# Patient Record
Sex: Female | Born: 1965 | Race: White | Hispanic: No | Marital: Single | Smoking: Current every day smoker
Health system: Southern US, Community
[De-identification: ages and names within clinical notes are randomized; demographics above are authoritative.]

## PROBLEM LIST (undated history)

## (undated) DIAGNOSIS — C801 Malignant (primary) neoplasm, unspecified: Secondary | ICD-10-CM

## (undated) DIAGNOSIS — I219 Acute myocardial infarction, unspecified: Secondary | ICD-10-CM

## (undated) DIAGNOSIS — D649 Anemia, unspecified: Secondary | ICD-10-CM

## (undated) DIAGNOSIS — J42 Unspecified chronic bronchitis: Secondary | ICD-10-CM

## (undated) DIAGNOSIS — R519 Headache, unspecified: Secondary | ICD-10-CM

## (undated) DIAGNOSIS — I10 Essential (primary) hypertension: Secondary | ICD-10-CM

## (undated) DIAGNOSIS — R51 Headache: Secondary | ICD-10-CM

## (undated) DIAGNOSIS — R011 Cardiac murmur, unspecified: Secondary | ICD-10-CM

## (undated) DIAGNOSIS — M1990 Unspecified osteoarthritis, unspecified site: Secondary | ICD-10-CM

---

## 1995-05-06 HISTORY — PX: LEEP: SHX91

## 2004-07-05 ENCOUNTER — Ambulatory Visit: Payer: Self-pay | Admitting: Family Medicine

## 2004-07-30 ENCOUNTER — Emergency Department: Payer: Self-pay | Admitting: Emergency Medicine

## 2005-03-30 ENCOUNTER — Emergency Department: Payer: Self-pay | Admitting: Emergency Medicine

## 2005-09-21 ENCOUNTER — Emergency Department: Payer: Self-pay | Admitting: General Practice

## 2007-03-16 ENCOUNTER — Emergency Department: Payer: Self-pay | Admitting: Unknown Physician Specialty

## 2007-07-21 ENCOUNTER — Emergency Department: Payer: Self-pay | Admitting: Unknown Physician Specialty

## 2008-05-31 ENCOUNTER — Ambulatory Visit: Payer: Self-pay | Admitting: General Surgery

## 2008-06-05 ENCOUNTER — Ambulatory Visit: Payer: Self-pay | Admitting: Gynecologic Oncology

## 2008-06-07 ENCOUNTER — Ambulatory Visit: Payer: Self-pay | Admitting: General Surgery

## 2008-06-20 ENCOUNTER — Ambulatory Visit: Payer: Self-pay | Admitting: Gynecologic Oncology

## 2008-07-19 ENCOUNTER — Ambulatory Visit: Payer: Self-pay | Admitting: General Surgery

## 2008-10-07 IMAGING — CR DG CHEST 2V
1 series · 2 of 2 positions shown · non-contrast
Comparison: none

REASON FOR EXAM: Cough
COMMENTS:

[Series 1: view not recorded · 0.17mm/px · 2 of 2 slices shown]
[im 1/2]
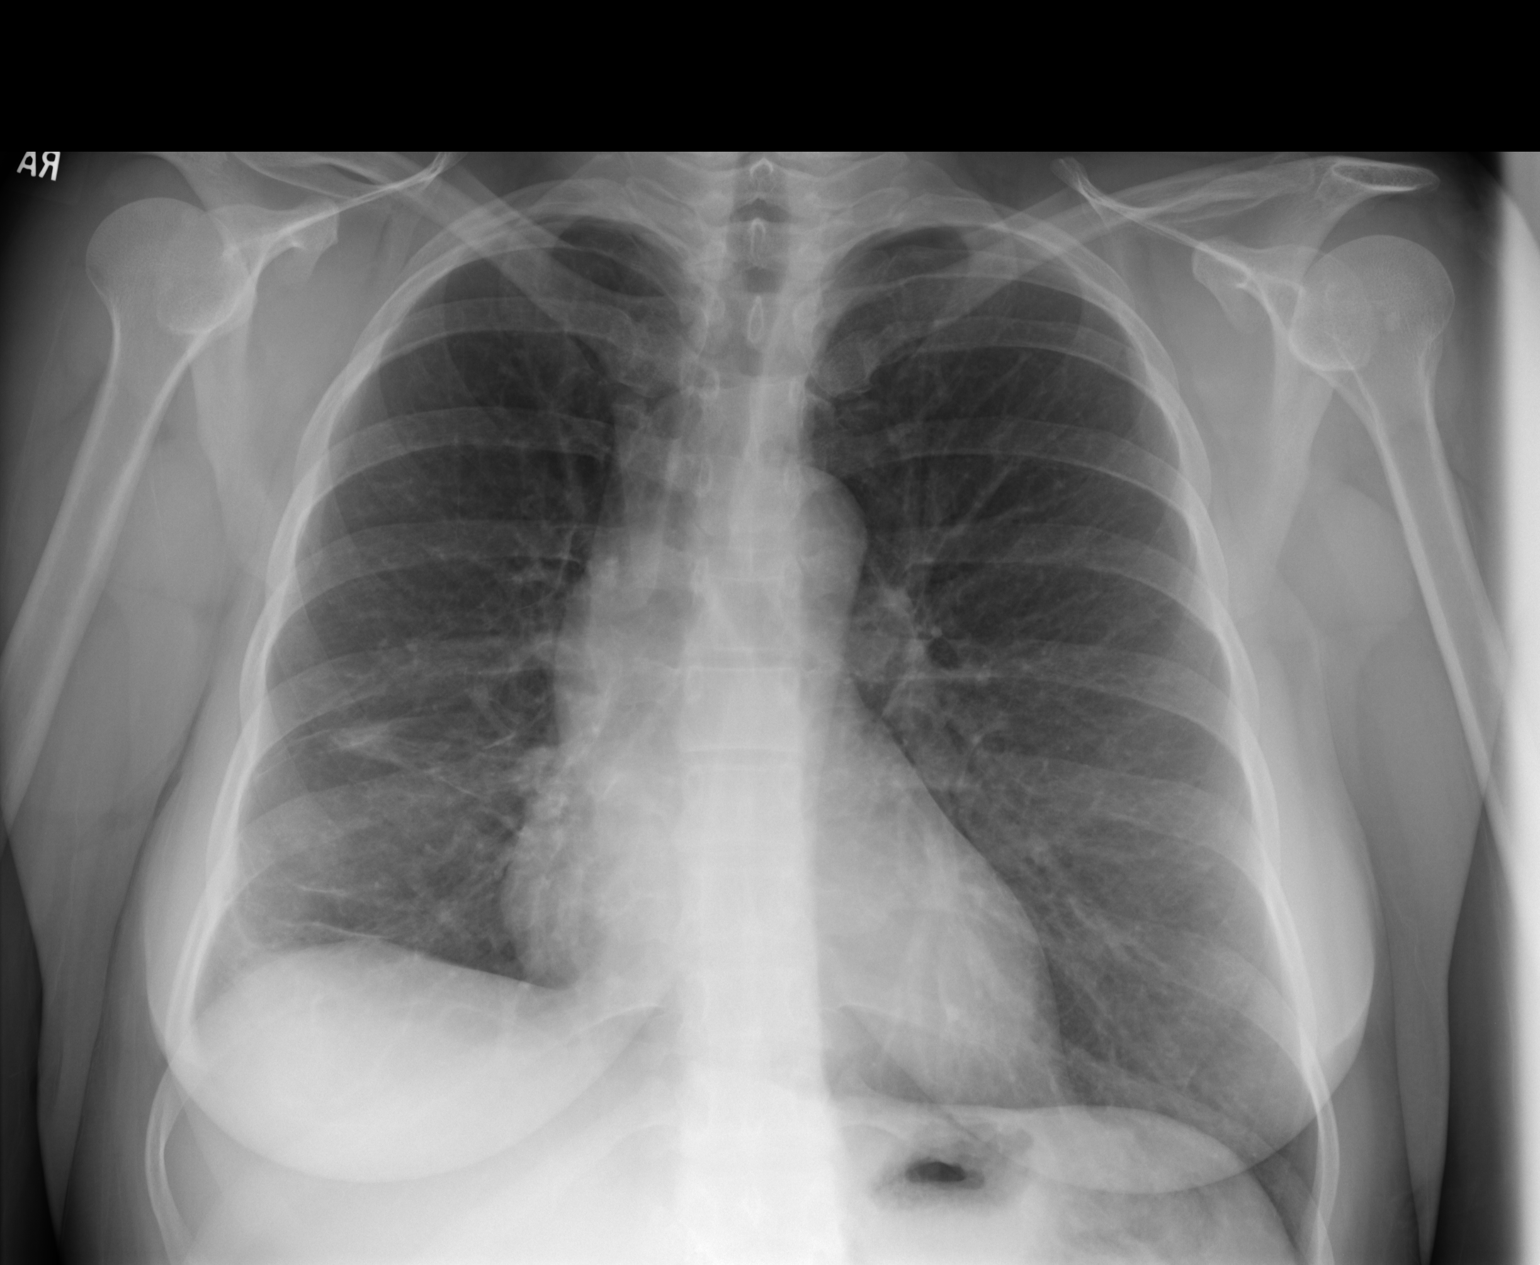
[im 2/2]
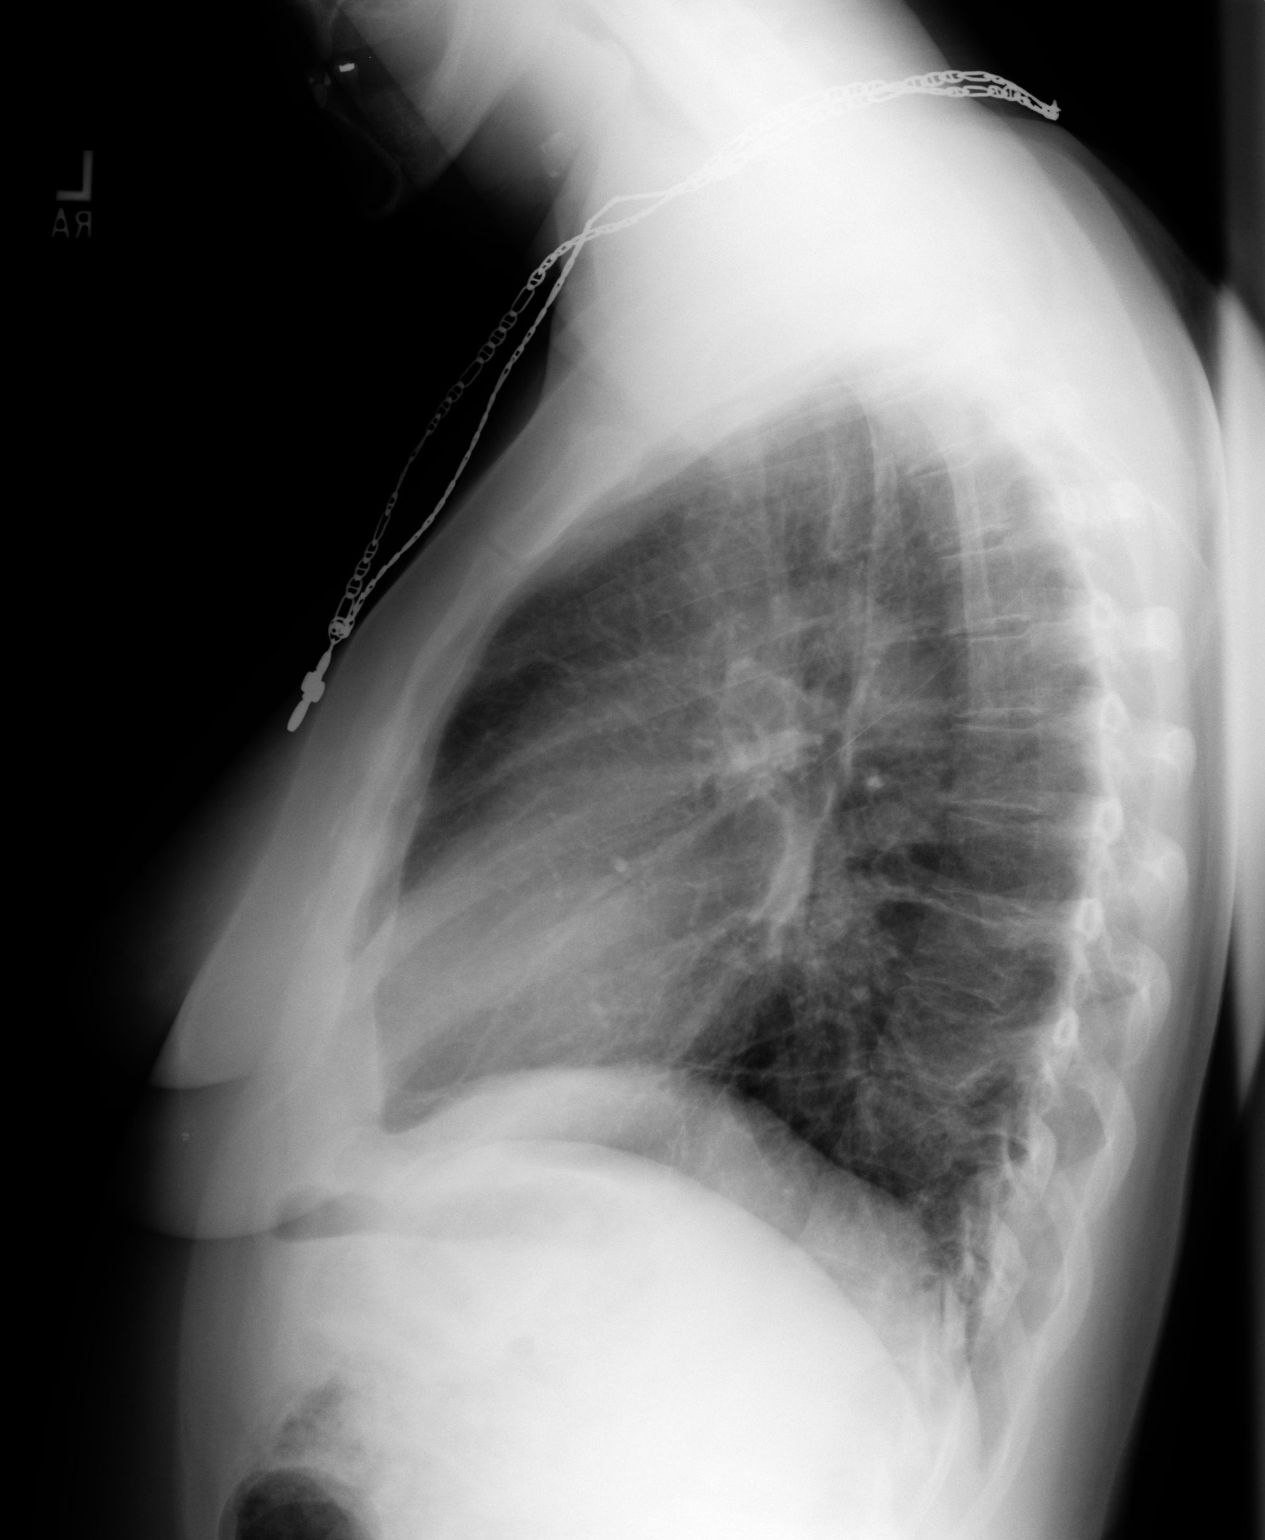

[2 of 2 positions shown; findings below may reference images not displayed]

PROCEDURE:     DXR - DXR CHEST PA (OR AP) AND LATERAL  - March 16, 2007 [DATE]

RESULT:     Linear areas of increased density project in the region of the
RIGHT lower lobe. No focal regions of consolidation are demonstrated. The
cardiac silhouette is within normal limits. The visualized bony skeleton is
unremarkable. There is elevation of the RIGHT hemidiaphragm.
IMPRESSION: Atelectasis versus scarring in the region of the RIGHT lower
lobe and early or mild infiltrate cannot be completely excluded, if
clinically warranted.

## 2008-11-02 ENCOUNTER — Ambulatory Visit: Payer: Self-pay | Admitting: Gynecologic Oncology

## 2008-11-13 ENCOUNTER — Ambulatory Visit: Payer: Self-pay | Admitting: Gynecologic Oncology

## 2010-06-27 ENCOUNTER — Emergency Department: Payer: Self-pay | Admitting: Emergency Medicine

## 2011-06-15 ENCOUNTER — Emergency Department: Payer: Self-pay | Admitting: Emergency Medicine

## 2011-08-21 ENCOUNTER — Emergency Department: Payer: Self-pay | Admitting: Emergency Medicine

## 2011-10-06 ENCOUNTER — Emergency Department: Payer: Self-pay | Admitting: Emergency Medicine

## 2011-10-18 ENCOUNTER — Emergency Department: Payer: Self-pay | Admitting: Emergency Medicine

## 2011-10-18 LAB — CK TOTAL AND CKMB (NOT AT ARMC): CK, Total: 57 U/L (ref 21–215)

## 2011-10-18 LAB — BASIC METABOLIC PANEL
Calcium, Total: 8.3 mg/dL — ABNORMAL LOW (ref 8.5–10.1)
Co2: 24 mmol/L (ref 21–32)
EGFR (African American): >60
Potassium: 4 mmol/L (ref 3.5–5.1)

## 2011-10-18 LAB — CBC
HGB: 11.6 g/dL — ABNORMAL LOW (ref 12.0–16.0)
Platelet: 181 10*3/uL (ref 150–440)
RBC: 3.93 10*6/uL (ref 3.80–5.20)

## 2011-11-07 ENCOUNTER — Emergency Department: Payer: Self-pay | Admitting: Emergency Medicine

## 2015-10-28 ENCOUNTER — Encounter: Payer: Self-pay | Admitting: Emergency Medicine

## 2015-10-28 ENCOUNTER — Emergency Department
Admission: EM | Admit: 2015-10-28 | Discharge: 2015-10-28 | Disposition: A | Discharge status: Home / Self Care | Payer: BLUE CROSS/BLUE SHIELD | Attending: Emergency Medicine | Admitting: Emergency Medicine

## 2015-10-28 DIAGNOSIS — I10 Essential (primary) hypertension: Secondary | ICD-10-CM | POA: Diagnosis not present

## 2015-10-28 DIAGNOSIS — L03116 Cellulitis of left lower limb: Secondary | ICD-10-CM | POA: Insufficient documentation

## 2015-10-28 DIAGNOSIS — F17210 Nicotine dependence, cigarettes, uncomplicated: Secondary | ICD-10-CM | POA: Insufficient documentation

## 2015-10-28 DIAGNOSIS — L539 Erythematous condition, unspecified: Secondary | ICD-10-CM | POA: Diagnosis present

## 2015-10-28 MED ORDER — CEPHALEXIN 500 MG PO CAPS: 500.0000 mg | ORAL_CAPSULE | Freq: Four times a day (QID) | ORAL | Status: DC

## 2015-10-28 NOTE — ED Provider Notes (Signed)
[facility] Emergency Department Provider Note  ____________________________________________  Time seen: Approximately 6:54 PM  I have reviewed the triage vital signs and the nursing notes.   HISTORY  Chief Complaint Foot Swelling    HPI Dorothy Gay is a 50 y.o. female who presents emergency department complaining of a "blister" between her first and second digits left foot. Patient states that she initially noticed a blister several days ago. It pops, and then returned before popping and turning into a erythematous lesion. Patient states that it is painful and irritating. She reports that there was some swelling around this area that is not as severe as it was yesterday. She denies any numbness or tingling in her foot. She denies any history of recurrent skin lesions.  Patient presents emergency Department with an elevated blood pressure of 210/90. Patient reports that she has a long-standing history of familial hypertension. Patient reports that her blood pressure has been running in the 200/100 range the past several months. Patient is aware of complications with high blood pressure and states that she has been in the process of obtaining insurance. This was approved within the last month and she has contacted primary care for an appointment. Patient states that she has a appointment scheduled follow-up with primary care for general physical as well as addressing her blood pressure. Patient denies any chest pain, shortness of breath, headache, visual changes, numbness or tinging in any extremity.  History reviewed. No pertinent past medical history.  There are no active problems to display for this patient.   Past Surgical History  Procedure Laterality Date  . Cesarean section  1988     Current Outpatient Rx  Name  Route  Sig  Dispense  Refill  . cephALEXin (KEFLEX) 500 MG capsule   Oral   Take 1 capsule (500 mg total) by mouth 4 (four) times daily.  28 capsule   0     Allergies Review of patient's allergies indicates no known allergies.  No family history on file.  Social History Social History  Substance Use Topics  . Smoking status: Current Every Day Smoker -- 0.50 packs/day    Types: Cigarettes  . Smokeless tobacco: Never Used  . Alcohol Use: No     Review of Systems  Constitutional: No fever/chills Cardiovascular: no chest pain. Respiratory: no cough. No SOB. Musculoskeletal: Negative for musculoskeletal pain. Skin: Positive for erythematous lesion between the first and second digit left foot. Neurological: Negative for headaches, focal weakness or numbness. 10-point ROS otherwise negative.  ____________________________________________   PHYSICAL EXAM:  VITAL SIGNS: ED Triage Vitals  Enc Vitals Group     BP 10/28/15 1738 209/93 mmHg     Pulse Rate 10/28/15 1738 66     Resp 10/28/15 1738 18     Temp 10/28/15 1738 98.1 F (36.7 C)     Temp src --      SpO2 10/28/15 1738 93 %     Weight 10/28/15 1738 170 lb (77.111 kg)     Height 10/28/15 1738 5\' 7"  (1.702 m)     Head Cir --      Peak Flow --      Pain Score 10/28/15 1743 4     Pain Loc --      Pain Edu? --      Excl. in [place]? --      Constitutional: Alert and oriented. Well appearing and in no acute distress. Eyes: Conjunctivae are normal. PERRL. EOMI. Head: Atraumatic. Cardiovascular:  Normal rate, regular rhythm. Normal S1 and S2.  Good peripheral circulation. Respiratory: Normal respiratory effort without tachypnea or retractions. Lungs CTAB. Good air entry to the bases with no decreased or absent breath sounds. Musculoskeletal: Full range of motion to all extremities. No gross deformities appreciated. Neurologic:  Normal speech and language. No gross focal neurologic deficits are appreciated. Cranial nerves II through XII are grossly intact. Skin:  Skin is warm, dry and intact. No rash noted. Erythematous and edematous skin lesion is noted between  the first and second digits left foot. There is a small scab consistent with excoriation from scratching noted in the center of this lesion. Area is firm to palpation. No fluctuance noted. No drainage noted. Area is tender to palpation. This measures approximately 1 x 2 cm. No surrounding erythema or edema. Capillary refill and sensation intact all 5 digits. Dorsalis pedis pulses appreciated in the affected extremity. Psychiatric: Mood and affect are normal. Speech and behavior are normal. Patient exhibits appropriate insight and judgement.   ____________________________________________   LABS (all labs ordered are listed, but only abnormal results are displayed)  Labs Reviewed - No data to display ____________________________________________  EKG   ____________________________________________  RADIOLOGY   No results found.  ____________________________________________    PROCEDURES  Procedure(s) performed:       Medications - No data to display   ____________________________________________   INITIAL IMPRESSION / ASSESSMENT AND PLAN / ED COURSE  Pertinent labs & imaging results that were available during my care of the patient were reviewed by me and considered in my medical decision making (see chart for details).  Patient's diagnosis is consistent with cellulitis of the left foot. No concern for abscess at this time. Area is localized.. Patient will be discharged home with prescriptions for antibiotics.. Patient has an elevated blood pressure in the emergency department. Patient is aware of this and states that she consistently has a blood pressure in the 200/100 range. Patient was waiting on insurance to see primary care which is now in place. She has an appointment to follow-up for general physical as well as addressing her blood pressure. As such, no medications will be prescribed for her blood pressure. She has no concerning symptoms of chest pain, shortness of  breath, headache, strokelike symptoms. Exam was reassuring at this time. Patient is given strict ED precautions to return to the ED for any worsening or new symptoms.     ____________________________________________  FINAL CLINICAL IMPRESSION(S) / ED DIAGNOSES  Final diagnoses:  Cellulitis of foot, left  Essential hypertension      NEW MEDICATIONS STARTED DURING THIS VISIT:  New Prescriptions   CEPHALEXIN (KEFLEX) 500 MG CAPSULE    Take 1 capsule (500 mg total) by mouth 4 (four) times daily.        This chart was dictated using voice recognition software/Dragon. Despite best efforts to proofread, errors can occur which can change the meaning. Any change was purely unintentional.    Darletta Moll, PA-C 10/28/15 1907  Orbie Pyo, MD 10/29/15 [phone]

## 2015-10-28 NOTE — ED Notes (Signed)
Pt with blisters that come and go between left great toe and second toe. Pain and swelling under left foot that started yesterday. Pt also c/o foot being itchy.

## 2015-10-28 NOTE — ED Notes (Signed)
Pt states she noticed a blister in between left big toe. Pt states she popped the blister today but it came back. Pt c/o pain to the top and bottom of the feet.  Pt worried that something is going with her right foot as well as there is a small red spot on the top of the right foot.

## 2015-10-28 NOTE — ED Notes (Signed)
Reviewed d/c instructions, follow-up care, and prescription with pt. Pt verbalized understanding 

## 2015-10-28 NOTE — Discharge Instructions (Signed)
Cellulitis Cellulitis is an infection of the skin and the tissue beneath it. The infected area is usually red and tender. Cellulitis occurs most often in the arms and lower legs.  CAUSES  Cellulitis is caused by bacteria that enter the skin through cracks or cuts in the skin. The most common types of bacteria that cause cellulitis are staphylococci and streptococci. SIGNS AND SYMPTOMS   Redness and warmth.  Swelling.  Tenderness or pain.  Fever. DIAGNOSIS  Your health care provider can usually determine what is wrong based on a physical exam. Blood tests may also be done. TREATMENT  Treatment usually involves taking an antibiotic medicine. HOME CARE INSTRUCTIONS   Take your antibiotic medicine as directed by your health care provider. Finish the antibiotic even if you start to feel better.  Keep the infected arm or leg elevated to reduce swelling.  Apply a warm cloth to the affected area up to 4 times per day to relieve pain.  Take medicines only as directed by your health care provider.  Keep all follow-up visits as directed by your health care provider. SEEK MEDICAL CARE IF:   You notice red streaks coming from the infected area.  Your red area gets larger or turns dark in color.  Your bone or joint underneath the infected area becomes painful after the skin has healed.  Your infection returns in the same area or another area.  You notice a swollen bump in the infected area.  You develop new symptoms.  You have a fever. SEEK IMMEDIATE MEDICAL CARE IF:   You feel very sleepy.  You develop vomiting or diarrhea.  You have a general ill feeling (malaise) with muscle aches and pains.   This information is not intended to replace advice given to you by your health care provider. Make sure you discuss any questions you have with your health care provider.   Document Released: 01/29/2005 Document Revised: 01/10/2015 Document Reviewed: 07/07/2011 Elsevier Interactive  Patient Education 2016 [employer].  Hypertension Hypertension, commonly called high blood pressure, is when the force of blood pumping through your arteries is too strong. Your arteries are the blood vessels that carry blood from your heart throughout your body. A blood pressure reading consists of a higher number over a lower number, such as 110/72. The higher number (systolic) is the pressure inside your arteries when your heart pumps. The lower number (diastolic) is the pressure inside your arteries when your heart relaxes. Ideally you want your blood pressure below 120/80. Hypertension forces your heart to work harder to pump blood. Your arteries may become narrow or stiff. Having untreated or uncontrolled hypertension can cause heart attack, stroke, kidney disease, and other problems. RISK FACTORS Some risk factors for high blood pressure are controllable. Others are not.  Risk factors you cannot control include:   Race. You may be at higher risk if you are African American.  Age. Risk increases with age.  Gender. Men are at higher risk than women before age 71 years. After age 51, women are at higher risk than men. Risk factors you can control include:  Not getting enough exercise or physical activity.  Being overweight.  Getting too much fat, sugar, calories, or salt in your diet.  Drinking too much alcohol. SIGNS AND SYMPTOMS Hypertension does not usually cause signs or symptoms. Extremely high blood pressure (hypertensive crisis) may cause headache, anxiety, shortness of breath, and nosebleed. DIAGNOSIS To check if you have hypertension, your health care provider will measure your  blood pressure while you are seated, with your arm held at the level of your heart. It should be measured at least twice using the same arm. Certain conditions can cause a difference in blood pressure between your right and left arms. A blood pressure reading that is higher than normal on one occasion  does not mean that you need treatment. If it is not clear whether you have high blood pressure, you may be asked to return on a different day to have your blood pressure checked again. Or, you may be asked to monitor your blood pressure at home for 1 or more weeks. TREATMENT Treating high blood pressure includes making lifestyle changes and possibly taking medicine. Living a healthy lifestyle can help lower high blood pressure. You may need to change some of your habits. Lifestyle changes may include:  Following the DASH diet. This diet is high in fruits, vegetables, and whole grains. It is low in salt, red meat, and added sugars.  Keep your sodium intake below 2,300 mg per day.  Getting at least 30-45 minutes of aerobic exercise at least 4 times per week.  Losing weight if necessary.  Not smoking.  Limiting alcoholic beverages.  Learning ways to reduce stress. Your health care provider may prescribe medicine if lifestyle changes are not enough to get your blood pressure under control, and if one of the following is true:  You are 16-63 years of age and your systolic blood pressure is above 140.  You are 13 years of age or older, and your systolic blood pressure is above 150.  Your diastolic blood pressure is above 90.  You have diabetes, and your systolic blood pressure is over [id number] or your diastolic blood pressure is over 90.  You have kidney disease and your blood pressure is above 140/90.  You have heart disease and your blood pressure is above 140/90. Your personal target blood pressure may vary depending on your medical conditions, your age, and other factors. HOME CARE INSTRUCTIONS  Have your blood pressure rechecked as directed by your health care provider.   Take medicines only as directed by your health care provider. Follow the directions carefully. Blood pressure medicines must be taken as prescribed. The medicine does not work as well when you skip doses. Skipping  doses also puts you at risk for problems.  Do not smoke.   Monitor your blood pressure at home as directed by your health care provider. SEEK MEDICAL CARE IF:   You think you are having a reaction to medicines taken.  You have recurrent headaches or feel dizzy.  You have swelling in your ankles.  You have trouble with your vision. SEEK IMMEDIATE MEDICAL CARE IF:  You develop a severe headache or confusion.  You have unusual weakness, numbness, or feel faint.  You have severe chest or abdominal pain.  You vomit repeatedly.  You have trouble breathing. MAKE SURE YOU:   Understand these instructions.  Will watch your condition.  Will get help right away if you are not doing well or get worse.   This information is not intended to replace advice given to you by your health care provider. Make sure you discuss any questions you have with your health care provider.   Document Released: 04/21/2005 Document Revised: 09/05/2014 Document Reviewed: 02/11/2013 Elsevier Interactive Patient Education [employer].

## 2016-05-05 DIAGNOSIS — I219 Acute myocardial infarction, unspecified: Secondary | ICD-10-CM

## 2016-05-05 HISTORY — DX: Acute myocardial infarction, unspecified: I21.9

## 2016-09-07 ENCOUNTER — Observation Stay
Admission: EM | Admit: 2016-09-07 | Discharge: 2016-09-08 | Disposition: A | Discharge status: Home / Self Care | Payer: BLUE CROSS/BLUE SHIELD | Attending: Internal Medicine | Admitting: Internal Medicine

## 2016-09-07 ENCOUNTER — Encounter: Payer: Self-pay | Admitting: Emergency Medicine

## 2016-09-07 ENCOUNTER — Emergency Department: Payer: BLUE CROSS/BLUE SHIELD

## 2016-09-07 DIAGNOSIS — R079 Chest pain, unspecified: Secondary | ICD-10-CM | POA: Diagnosis not present

## 2016-09-07 DIAGNOSIS — Z79899 Other long term (current) drug therapy: Secondary | ICD-10-CM | POA: Diagnosis not present

## 2016-09-07 DIAGNOSIS — F17210 Nicotine dependence, cigarettes, uncomplicated: Secondary | ICD-10-CM | POA: Diagnosis not present

## 2016-09-07 DIAGNOSIS — I10 Essential (primary) hypertension: Secondary | ICD-10-CM | POA: Diagnosis not present

## 2016-09-07 DIAGNOSIS — E876 Hypokalemia: Secondary | ICD-10-CM | POA: Diagnosis not present

## 2016-09-07 DIAGNOSIS — Z8249 Family history of ischemic heart disease and other diseases of the circulatory system: Secondary | ICD-10-CM | POA: Diagnosis not present

## 2016-09-07 DIAGNOSIS — R002 Palpitations: Secondary | ICD-10-CM | POA: Diagnosis present

## 2016-09-07 DIAGNOSIS — Z888 Allergy status to other drugs, medicaments and biological substances status: Secondary | ICD-10-CM | POA: Diagnosis not present

## 2016-09-07 HISTORY — DX: Essential (primary) hypertension: I10

## 2016-09-07 LAB — CBC
HCT: 41.1 % (ref 35.0–47.0)
Hemoglobin: 13.9 g/dL (ref 12.0–16.0)
MCH: 29.5 pg (ref 26.0–34.0)
MCHC: 33.7 g/dL (ref 32.0–36.0)
MCV: 87.5 fL (ref 80.0–100.0)
PLATELETS: 234 10*3/uL (ref 150–440)
RBC: 4.7 MIL/uL (ref 3.80–5.20)
RDW: 14.1 % (ref 11.5–14.5)
WBC: 6.9 10*3/uL (ref 3.6–11.0)

## 2016-09-07 LAB — TROPONIN I
TROPONIN I: 0.06 ng/mL — AB (ref <0.03)
TROPONIN I: 0.04 ng/mL — AB (ref <0.03)
Troponin I: 0.05 ng/mL (ref <0.03)

## 2016-09-07 LAB — BASIC METABOLIC PANEL
ANION GAP: 10 (ref 5–15)
BUN: 8 mg/dL (ref 6–20)
CALCIUM: 9.1 mg/dL (ref 8.9–10.3)
CO2: 25 mmol/L (ref 22–32)
CREATININE: 0.62 mg/dL (ref 0.44–1.00)
Chloride: 106 mmol/L (ref 101–111)
GLUCOSE: 128 mg/dL — AB (ref 65–99)
Potassium: 2.9 mmol/L — ABNORMAL LOW (ref 3.5–5.1)
Sodium: 141 mmol/L (ref 135–145)

## 2016-09-07 LAB — MAGNESIUM: MAGNESIUM: 2.1 mg/dL (ref 1.7–2.4)

## 2016-09-07 MED ORDER — ACETAMINOPHEN 650 MG RE SUPP: 650.0000 mg | Freq: Four times a day (QID) | RECTAL | Status: DC | PRN

## 2016-09-07 MED ORDER — ONDANSETRON HCL 4 MG PO TABS
4.0000 mg | ORAL_TABLET | Freq: Four times a day (QID) | ORAL | Status: DC | PRN
Start: 2016-09-07 — End: 2016-09-08

## 2016-09-07 MED ORDER — NITROGLYCERIN 0.4 MG SL SUBL: 0.4000 mg | SUBLINGUAL_TABLET | SUBLINGUAL | Status: DC | PRN

## 2016-09-07 MED ORDER — ACETAMINOPHEN 325 MG PO TABS: 650.0000 mg | ORAL_TABLET | Freq: Four times a day (QID) | ORAL | Status: DC | PRN

## 2016-09-07 MED ORDER — ASPIRIN EC 81 MG PO TBEC
81.0000 mg | DELAYED_RELEASE_TABLET | Freq: Every day | ORAL | Status: DC
  Administered 2016-09-08: 81 mg via ORAL
  Filled 2016-09-07: qty 1

## 2016-09-07 MED ORDER — ENOXAPARIN SODIUM 40 MG/0.4ML ~~LOC~~ SOLN
40.0000 mg | SUBCUTANEOUS | Status: DC
  Administered 2016-09-07: 40 mg via SUBCUTANEOUS
  Filled 2016-09-07: qty 0.4

## 2016-09-07 MED ORDER — MORPHINE SULFATE (PF) 2 MG/ML IV SOLN: 2.0000 mg | INTRAVENOUS | Status: DC | PRN

## 2016-09-07 MED ORDER — HYDROCHLOROTHIAZIDE 12.5 MG PO CAPS
12.5000 mg | ORAL_CAPSULE | Freq: Every day | ORAL | Status: DC
  Administered 2016-09-08: 12.5 mg via ORAL
  Filled 2016-09-07: qty 1

## 2016-09-07 MED ORDER — ONDANSETRON HCL 4 MG/2ML IJ SOLN: 4.0000 mg | Freq: Four times a day (QID) | INTRAMUSCULAR | Status: DC | PRN

## 2016-09-07 MED ORDER — POTASSIUM CHLORIDE CRYS ER 20 MEQ PO TBCR
20.0000 meq | EXTENDED_RELEASE_TABLET | Freq: Two times a day (BID) | ORAL | Status: DC
  Administered 2016-09-07 – 2016-09-08 (×2): 20 meq via ORAL
  Filled 2016-09-07 (×2): qty 1

## 2016-09-07 MED ORDER — AMLODIPINE BESYLATE 10 MG PO TABS
10.0000 mg | ORAL_TABLET | Freq: Every day | ORAL | Status: DC
  Filled 2016-09-07: qty 1

## 2016-09-07 MED ORDER — ASPIRIN 81 MG PO CHEW
324.0000 mg | CHEWABLE_TABLET | Freq: Once | ORAL | Status: AC
  Administered 2016-09-07: 324 mg via ORAL
  Filled 2016-09-07: qty 4

## 2016-09-07 NOTE — ED Notes (Signed)
Sarah, EDT to bedside to draw 2nd troponin.

## 2016-09-07 NOTE — ED Notes (Signed)
Date and time results received: 09/07/16 1610   Test: Troponin Critical Value: 0.06  Name of Provider Notified: Dr. Archie Balboa  Orders Received? Or Actions Taken?: Orders Received - See Orders for details and Actions Taken: Pt to be admitted.

## 2016-09-07 NOTE — Progress Notes (Signed)
Pt. Signed consent for stress test. Placed on chart

## 2016-09-07 NOTE — ED Notes (Signed)
This RN to bedside at this time. Apologized repeatedly to patient and family for delay in getting into room, explained multiple critical patients, pt and family state understanding. Pt states that she has been dozing. This RN explained was waiting for 2nd troponin to come back. Pt states understanding at this time. NAD noted, respirations even and unlabored, skin warm, dry, and intact. Pt denies any needs at this time, will continue to monitor.

## 2016-09-07 NOTE — ED Notes (Signed)
Pt was given a cup of water per request with MD approval

## 2016-09-07 NOTE — H&P (Signed)
[facility] NAME: Dorothy Gay    MR#:  [id number]  DATE OF BIRTH:  07-09-1965  DATE OF ADMISSION:  09/07/2016  PRIMARY CARE PHYSICIAN: Patient, No Pcp Per   REQUESTING/REFERRING PHYSICIAN: Dr. Harvest Dark  CHIEF COMPLAINT:   Chief Complaint  Patient presents with  . Palpitations    HISTORY OF PRESENT ILLNESS:  Dorothy Gay  is a 51 y.o. female with a known history of Essential hypertension who presents to the hospital due to palpitations and some chest heaviness. Patient says she woke up this morning around 9:30 this morning feeling like her heart was racing associated with some tightness in her chest and also left arm pain. She also had some nausea but no vomiting. She admits to a headache but no dizziness diaphoresis or syncope. Patient has never had these symptoms before. She denies any previous history of heart attacks, or any worsening anxiety or stressor in her life presently. She presented to the ER and underwent blood work which showed a mildly elevated troponin. Hospitalist services were contacted further treatment and evaluation.  PAST MEDICAL HISTORY:   Past Medical History:  Diagnosis Date  . Hypertension     PAST SURGICAL HISTORY:   Past Surgical History:  Procedure Laterality Date  . CESAREAN SECTION  1988     SOCIAL HISTORY:   Social History  Substance Use Topics  . Smoking status: Current Every Day Smoker    Packs/day: 0.50    Types: Cigarettes  . Smokeless tobacco: Never Used  . Alcohol use No    FAMILY HISTORY:   Family History  Problem Relation Age of Onset  . Hypertension Mother   . Atrial fibrillation Mother   . Heart disease Maternal Grandmother     DRUG ALLERGIES:   Allergies  Allergen Reactions  . Amlodipine Nausea And Vomiting  . Hydrochlorothiazide Nausea And Vomiting  . Lisinopril Cough    REVIEW OF SYSTEMS:   Review of Systems  Constitutional: Negative for fever and  weight loss.  HENT: Negative for congestion, nosebleeds and tinnitus.   Eyes: Negative for blurred vision, double vision and redness.  Respiratory: Negative for cough, hemoptysis and shortness of breath.   Cardiovascular: Positive for chest pain and palpitations. Negative for orthopnea, leg swelling and PND.  Gastrointestinal: Negative for abdominal pain, diarrhea, melena, nausea and vomiting.  Genitourinary: Negative for dysuria, hematuria and urgency.  Musculoskeletal: Negative for falls and joint pain.  Neurological: Negative for dizziness, tingling, sensory change, focal weakness, seizures, weakness and headaches.  Endo/Heme/Allergies: Negative for polydipsia. Does not bruise/bleed easily.  Psychiatric/Behavioral: Negative for depression and memory loss. The patient is not nervous/anxious.     MEDICATIONS AT HOME:   Prior to Admission medications   Medication Sig Start Date End Date Taking? Authorizing Provider  amLODipine (NORVASC) 10 MG tablet Take 10 mg by mouth daily. 06/13/16  Yes [provider]  hydrochlorothiazide (MICROZIDE) 12.5 MG capsule Take 12.5 mg by mouth daily. 06/13/16  Yes [provider]  cephALEXin (KEFLEX) 500 MG capsule Take 1 capsule (500 mg total) by mouth 4 (four) times daily. Patient not taking: Reported on 09/07/2016 10/28/15   Cuthriell, Charline Bills, PA-C      VITAL SIGNS:  Blood pressure (!) 162/79, pulse (!) 55, temperature 98.1 F (36.7 C), temperature source Oral, resp. rate 16, height 5\' 7"  (1.702 m), weight 77.1 kg (170 lb), SpO2 96 %.  PHYSICAL EXAMINATION:  Physical Exam  GENERAL:  51  y.o.-year-old patient lying in bed in no acute distress.  EYES: Pupils equal, round, reactive to light and accommodation. No scleral icterus. Extraocular muscles intact.  HEENT: Head atraumatic, normocephalic. Oropharynx and nasopharynx clear. No oropharyngeal erythema, moist oral mucosa  NECK:  Supple, no jugular venous distention. No thyroid  enlargement, no tenderness.  LUNGS: Normal breath sounds bilaterally, no wheezing, rales, rhonchi. No use of accessory muscles of respiration.  CARDIOVASCULAR: S1, S2 RRR. No murmurs, rubs, gallops, clicks.  ABDOMEN: Soft, nontender, nondistended. Bowel sounds present. No organomegaly or mass.  EXTREMITIES: No pedal edema, cyanosis, or clubbing. + 2 pedal & radial pulses b/l.   NEUROLOGIC: Cranial nerves II through XII are intact. No focal Motor or sensory deficits appreciated b/l.  PSYCHIATRIC: The patient is alert and oriented x 3. SKIN: No obvious rash, lesion, or ulcer.   LABORATORY PANEL:   CBC  Recent Labs Lab 09/07/16 1137  WBC 6.9  HGB 13.9  HCT 41.1  PLT 234   ------------------------------------------------------------------------------------------------------------------  Chemistries   Recent Labs Lab 09/07/16 1137  NA 141  K 2.9*  CL 106  CO2 25  GLUCOSE 128*  BUN 8  CREATININE 0.62  CALCIUM 9.1   ------------------------------------------------------------------------------------------------------------------  Cardiac Enzymes  Recent Labs Lab 09/07/16 1505  TROPONINI 0.06*   ------------------------------------------------------------------------------------------------------------------  RADIOLOGY:  Dg Chest 2 View  Result Date: 09/07/2016 CLINICAL DATA:  Heart palpitations today. History of hypertension. Smoker. EXAM: CHEST  2 VIEW COMPARISON:  Radiographs 03/16/2007. FINDINGS: The heart size and mediastinal contours are stable. There is stable mild pleural thickening and right basilar scarring. The left lung is clear. No edema, confluent airspace opacity, pleural effusion or pneumothorax demonstrated. The bones appear unchanged. IMPRESSION: Stable chest with stable right basilar scarring. No acute cardiopulmonary process. Electronically Signed   By: Richardean Sale M.D.   On: 09/07/2016 12:13     IMPRESSION AND PLAN:   51 year old female with  past medical history of hypertension, tobacco abuse who presents to the hospital due to palpitations, chest tightness.  1. Chest pain/tightness-recent symptoms are very atypical but she does have significant cardiac risk factors given her hypertension, tobacco abuse and family history of heart disease. -We'll observe him on telemetry, her second troponin is only mildly elevated and I will continue to cycle them. -EKG shows LVH with voltage anterior but no other acute ST or T wave changes. -Continue aspirin, nitroglycerin, oxygen, morphine. I will get a nuclear medicine stress test in the morning.  2. Essential hypertension-continue Norvasc, HCTZ.  3. Hypokalemia-we'll place on oral potassium supplements, repeat level in the morning. Check magnesium level.    All the records are reviewed and case discussed with ED provider. Management plans discussed with the patient, family and they are in agreement.  CODE STATUS: Full code  TOTAL TIME TAKING CARE OF THIS PATIENT: 40 minutes.    Henreitta Leber M.D on 09/07/2016 at 4:23 PM  Between 7am to 6pm - Pager - [phone]  After 6pm go to [email or web address] - password [email or web address] [facility]  Office  [phone]  CC: Primary care physician; Patient, No Pcp Per

## 2016-09-07 NOTE — ED Notes (Signed)
This RN to bedside, pt visualized in NAD at this time. Pt states she wants to know what her son can bring her to eat and if she can wear pajamas, this RN informed patient that she could wear pajamas but would have to remain on cardiac monitor. Pt states understanding. This RN apologized for delay. Will continue to monitor for further patient needs.

## 2016-09-07 NOTE — ED Provider Notes (Signed)
[facility] Emergency Department Provider Note  Time seen: 1:51 PM  I have reviewed the triage vital signs and the nursing notes.   HISTORY  Chief Complaint Palpitations    HPI Dorothy Gay is a 51 y.o. female with a past medical history of hypertension who presents to the emergency department for palpitations. According to the patient she awoke this morning around 9:30 and felt like her heart was racing and beating very fast in her chest. Denies any chest pain. States she was having some mild left arm pain in her forearm but states a history of arthritis with frequent pain to this area. Denies any recent leg pain or swelling. Denies any shortness of breath nausea or diaphoresis. Denies any chest pain currently. States the heart appeared to slow down approximately an hour to before arrival to the emergency department. Patient states she did take her blood pressure medication amlodipine after the palpitations started which seem to help.  Past Medical History:  Diagnosis Date  . Hypertension     There are no active problems to display for this patient.   Past Surgical History:  Procedure Laterality Date  . [place]     Prior to Admission medications   Medication Sig Start Date End Date Taking? Authorizing Provider  amLODipine (NORVASC) 10 MG tablet Take 10 mg by mouth daily. 06/13/16  Yes [provider]  hydrochlorothiazide (MICROZIDE) 12.5 MG capsule Take 12.5 mg by mouth daily. 06/13/16  Yes [provider]  cephALEXin (KEFLEX) 500 MG capsule Take 1 capsule (500 mg total) by mouth 4 (four) times daily. Patient not taking: Reported on 09/07/2016 10/28/15   Cuthriell, Charline Bills, PA-C    Allergies  Allergen Reactions  . Amlodipine Nausea And Vomiting  . Hydrochlorothiazide Nausea And Vomiting  . Lisinopril Cough    No family history on file.  Social History Social History  Substance Use Topics  . Smoking status:  Current Every Day Smoker    Packs/day: 0.50    Types: Cigarettes  . Smokeless tobacco: Never Used  . Alcohol use No    Review of Systems Constitutional: Negative for fever. Cardiovascular: Negative for chest pain.Positive for palpitations. Respiratory: Negative for shortness of breath. Gastrointestinal: Negative for abdominal pain, vomiting and diarrhea. Genitourinary: Negative for dysuria. Musculoskeletal: Positive for right knee pain, chronic. Negative for new leg pain or swelling Skin: Negative for rash. Neurological: Negative for headaches, focal weakness or numbness. All other ROS negative  ____________________________________________   PHYSICAL EXAM:  VITAL SIGNS: ED Triage Vitals [09/07/16 1135]  Enc Vitals Group     BP (!) 159/100     Pulse Rate 75     Resp 16     Temp 98.1 F (36.7 C)     Temp Source Oral     SpO2 98 %     Weight 170 lb (77.1 kg)     Height 5\' 7"  (1.702 m)     Head Circumference      Peak Flow      Pain Score      Pain Loc      Pain Edu?      Excl. in [place]?     Constitutional: Alert and oriented. Well appearing and in no distress. Eyes: Normal exam ENT   Head: Normocephalic and atraumatic.   Mouth/Throat: Mucous membranes are moist. Cardiovascular: Normal rate, regular rhythm. No murmur Respiratory: Normal respiratory effort without tachypnea nor retractions. Breath sounds are clear  Gastrointestinal: Soft  and nontender. No distention.  Musculoskeletal: Nontender with normal range of motion in all extremities. No lower extremity tenderness or edema. Neurologic:  Normal speech and language. No gross focal neurologic deficits  Skin:  Skin is warm, dry and intact.  Psychiatric: Mood and affect are normal.   ____________________________________________    EKG  EKG reviewed and interpreted by myself shows normal sinus rhythm at 74 bpm, narrow QRS, normal axis, normal intervals, nonspecific ST changes without ST  elevation.  ____________________________________________    RADIOLOGY  Chest x-ray negative  ____________________________________________   INITIAL IMPRESSION / ASSESSMENT AND PLAN / ED COURSE  Pertinent labs & imaging results that were available during my care of the patient were reviewed by me and considered in my medical decision making (see chart for details).  Patient presents to the emergency department palpitations beginning at 9:30 this morning. None exam the patient appears well, no acute distress. Denies any chest pain or palpitations currently. Patient's labs are largely within normal limits with a negative troponin. Chest x-ray is negative. EKG shows no acute abnormality. We'll continue to monitor the patient on a cardiac monitor. We will recheck a troponin. As long as the repeat troponin is normal patient will be discharged with cardiology follow-up for a Holter monitor. Patient agreeable to plan.   Repeat troponin is elevated 0.06. Given the patient's elevated troponin with symptoms this morning we'll admit to the hospital for further treatment. We will dose aspirin in the emergency department.  ____________________________________________   FINAL CLINICAL IMPRESSION(S) / ED DIAGNOSES  Palpitations    Harvest Dark, MD 09/07/16 [phone]

## 2016-09-07 NOTE — ED Triage Notes (Signed)
C/O "heart beating fast and feeling like it was going to pop out of chest".  States she feels better currently but works out of State in [place] and did not feel right driving feeling like this.  Symptoms started this morning at 1100.

## 2016-09-07 NOTE — ED Notes (Signed)
This RN to bedside at this time to start IV, IV obtained to L wrist in 1 attempt. Pt remains alert and oriented, explained to patient after IV started would call admission report and patient would go upstairs to a bed. Pt states understanding. Pt up to use the bathroom, pt is noted to be ambulatory with no difficulty. Will continue to monitor for further patient needs.

## 2016-09-08 ENCOUNTER — Encounter: Payer: Self-pay | Admitting: Radiology

## 2016-09-08 ENCOUNTER — Observation Stay (HOSPITAL_BASED_OUTPATIENT_CLINIC_OR_DEPARTMENT_OTHER): Payer: BLUE CROSS/BLUE SHIELD

## 2016-09-08 DIAGNOSIS — R079 Chest pain, unspecified: Secondary | ICD-10-CM | POA: Diagnosis not present

## 2016-09-08 DIAGNOSIS — R0789 Other chest pain: Secondary | ICD-10-CM | POA: Diagnosis not present

## 2016-09-08 LAB — NM MYOCAR MULTI W/SPECT W/WALL MOTION / EF
CHL CUP RESTING HR STRESS: 44 {beats}/min
LVDIAVOL: 119 mL (ref 46–106)
LVSYSVOL: 33 mL
NUC STRESS TID: 1.08
Percent HR: 47 %
SDS: 1
SRS: 11
SSS: 6

## 2016-09-08 LAB — POTASSIUM: Potassium: 3.1 mmol/L — ABNORMAL LOW (ref 3.5–5.1)

## 2016-09-08 LAB — TROPONIN I: Troponin I: 0.03 ng/mL (ref <0.03)

## 2016-09-08 MED ORDER — TECHNETIUM TC 99M TETROFOSMIN IV KIT
31.6800 | PACK | Freq: Once | INTRAVENOUS | Status: AC | PRN
  Administered 2016-09-08: 31.68 via INTRAVENOUS

## 2016-09-08 MED ORDER — HYDRALAZINE HCL 25 MG PO TABS: 25.0000 mg | ORAL_TABLET | Freq: Three times a day (TID) | ORAL | Status: DC

## 2016-09-08 MED ORDER — REGADENOSON 0.4 MG/5ML IV SOLN
0.4000 mg | Freq: Once | INTRAVENOUS | Status: AC
  Administered 2016-09-08: 0.4 mg via INTRAVENOUS

## 2016-09-08 MED ORDER — HYDROCHLOROTHIAZIDE 12.5 MG PO CAPS
12.5000 mg | ORAL_CAPSULE | Freq: Once | ORAL | Status: AC
  Administered 2016-09-08: 12.5 mg via ORAL

## 2016-09-08 MED ORDER — HYDROCHLOROTHIAZIDE 25 MG PO TABS: 25.0000 mg | ORAL_TABLET | Freq: Every day | ORAL | 1 refills | Status: DC

## 2016-09-08 MED ORDER — TECHNETIUM TC 99M TETROFOSMIN IV KIT
13.0000 | PACK | Freq: Once | INTRAVENOUS | Status: AC | PRN
  Administered 2016-09-08: 12.727 via INTRAVENOUS

## 2016-09-08 MED ORDER — ASPIRIN 81 MG PO TBEC: 81.0000 mg | DELAYED_RELEASE_TABLET | Freq: Every day | ORAL | 1 refills | Status: DC

## 2016-09-08 MED ORDER — HYDROCHLOROTHIAZIDE 25 MG PO TABS: 25.0000 mg | ORAL_TABLET | Freq: Every day | ORAL | Status: DC

## 2016-09-08 NOTE — Progress Notes (Signed)
Patient discharged via wheelchair and private vehicle. IV removed and catheter intact. All discharge instructions given and patient verbalizes understanding. Tele removed and returned. No prescriptions given to patient No distress noted.   

## 2016-09-08 NOTE — Discharge Summary (Signed)
[facility] NAME: Dorothy Gay    MR#:  [id number]  DATE OF BIRTH:  1965/11/20  DATE OF ADMISSION:  09/07/2016 ADMITTING PHYSICIAN: Henreitta Leber, MD  DATE OF DISCHARGE: 09/08/16  PRIMARY CARE PHYSICIAN: Patient, No Pcp Per    ADMISSION DIAGNOSIS:  Palpitations [R00.2] Chest pain, unspecified type [R07.9]  DISCHARGE DIAGNOSIS:  Palpitation-resolved Chest pain  SECONDARY DIAGNOSIS:   Past Medical History:  Diagnosis Date  . Hypertension     HOSPITAL COURSE:   51 year old female with past medical history of hypertension, tobacco abuse who presents to the hospital due to palpitations, chest tightness.  1. Chest pain/tightness-recent symptoms are very atypical but she does have significant cardiac risk factors given her hypertension, tobacco abuse and family history of heart disease. -troponin 0.03--0.05--0.04--0.03 -no cp at present -EKG shows LVH with voltage anterior but no other acute ST or T wave changes. -Continue aspirin, nitroglycerin, oxygen, morphine. - nuclear medicine stress test negative. EF 55%. Pt informed of the results.  2. Essential hypertension-continue  HCTZ.  3. Hypokalemia-we'll place on oral potassium supplements -pt advised  To eat banana and drink OJ on daily basis  D/c home Will get her appt to establish PCP at [facility] clinic  CONSULTS OBTAINED:    DRUG ALLERGIES:   Allergies  Allergen Reactions  . Amlodipine Nausea And Vomiting  . Lisinopril Cough    DISCHARGE MEDICATIONS:   Current Discharge Medication List    START taking these medications   Details  aspirin EC 81 MG EC tablet Take 1 tablet (81 mg total) by mouth daily. Qty: 30 tablet, Refills: 1    hydrochlorothiazide (HYDRODIURIL) 25 MG tablet Take 1 tablet (25 mg total) by mouth daily. Qty: 30 tablet, Refills: 1      STOP taking these medications     amLODipine (NORVASC) 10 MG tablet      hydrochlorothiazide  (MICROZIDE) 12.5 MG capsule      cephALEXin (KEFLEX) 500 MG capsule         If you experience worsening of your admission symptoms, develop shortness of breath, life threatening emergency, suicidal or homicidal thoughts you must seek medical attention immediately by calling 911 or calling your MD immediately  if symptoms less severe.  You Must read complete instructions/literature along with all the possible adverse reactions/side effects for all the Medicines you take and that have been prescribed to you. Take any new Medicines after you have completely understood and accept all the possible adverse reactions/side effects.   Please note  You were cared for by a hospitalist during your hospital stay. If you have any questions about your discharge medications or the care you received while you were in the hospital after you are discharged, you can call the unit and asked to speak with the hospitalist on call if the hospitalist that took care of you is not available. Once you are discharged, your primary care physician will handle any further medical issues. Please note that NO REFILLS for any discharge medications will be authorized once you are discharged, as it is imperative that you return to your primary care physician (or establish a relationship with a primary care physician if you do not have one) for your aftercare needs so that they can reassess your need for medications and monitor your lab values. Today   SUBJECTIVE    Doing OK. No CP VITAL SIGNS:  Blood pressure (!) 158/81, pulse (!) 50, temperature 98.6 F (  37 C), temperature source Oral, resp. rate 18, height 5\' 7"  (1.702 m), weight 76.7 kg (169 lb), SpO2 95 %.  I/O:   Intake/Output Summary (Last 24 hours) at 09/08/16 1211 Last data filed at 09/08/16 0300  Gross per 24 hour  Intake                0 ml  Output              500 ml  Net             -500 ml    PHYSICAL EXAMINATION:  GENERAL:  51 y.o.-year-old patient lying  in the bed with no acute distress.  EYES: Pupils equal, round, reactive to light and accommodation. No scleral icterus. Extraocular muscles intact.  HEENT: Head atraumatic, normocephalic. Oropharynx and nasopharynx clear.  NECK:  Supple, no jugular venous distention. No thyroid enlargement, no tenderness.  LUNGS: Normal breath sounds bilaterally, no wheezing, rales,rhonchi or crepitation. No use of accessory muscles of respiration.  CARDIOVASCULAR: S1, S2 normal. No murmurs, rubs, or gallops.  ABDOMEN: Soft, non-tender, non-distended. Bowel sounds present. No organomegaly or mass.  EXTREMITIES: No pedal edema, cyanosis, or clubbing.  NEUROLOGIC: Cranial nerves II through XII are intact. Muscle strength 5/5 in all extremities. Sensation intact. Gait not checked.  PSYCHIATRIC: The patient is alert and oriented x 3.  SKIN: No obvious rash, lesion, or ulcer.   DATA REVIEW:   CBC   Recent Labs Lab 09/07/16 1137  WBC 6.9  HGB 13.9  HCT 41.1  PLT 234    Chemistries   Recent Labs Lab 09/07/16 1137 09/08/16 0205  NA 141  --   K 2.9* 3.1*  CL 106  --   CO2 25  --   GLUCOSE 128*  --   BUN 8  --   CREATININE 0.62  --   CALCIUM 9.1  --   MG 2.1  --     Microbiology Results   No results found for this or any previous visit (from the past 240 hour(s)).  RADIOLOGY:  Dg Chest 2 View  Result Date: 09/07/2016 CLINICAL DATA:  Heart palpitations today. History of hypertension. Smoker. EXAM: CHEST  2 VIEW COMPARISON:  Radiographs 03/16/2007. FINDINGS: The heart size and mediastinal contours are stable. There is stable mild pleural thickening and right basilar scarring. The left lung is clear. No edema, confluent airspace opacity, pleural effusion or pneumothorax demonstrated. The bones appear unchanged. IMPRESSION: Stable chest with stable right basilar scarring. No acute cardiopulmonary process. Electronically Signed   By: Richardean Sale M.D.   On: 09/07/2016 12:13   Nm Myocar Multi  W/spect W/wall Motion / Ef  Result Date: 09/08/2016  There was no ST segment deviation noted during stress.  No T wave inversion was noted during stress.  Defect 1: There is a small defect of mild severity present in the apex location. This is likely due to breast attenuation.  The study is normal.  This is a low risk study.  The left ventricular ejection fraction is normal (55-65%).      Management plans discussed with the patient, family and they are in agreement.  CODE STATUS:     Code Status Orders        Start     Ordered   09/07/16 1817  Full code  Continuous     09/07/16 1816    Code Status History    Date Active Date Inactive Code Status Order ID Comments User Context  This patient has a current code status but no historical code status.      TOTAL TIME TAKING CARE OF THIS PATIENT: *40* minutes.    Reene Harlacher M.D on 09/08/2016 at 12:11 PM  Between 7am to 6pm - Pager - [phone] After 6pm go to [email or web address] - password [email or web address] [facility]  Office  [phone]  CC: Primary care physician; Patient, No Pcp Per

## 2016-09-08 NOTE — Discharge Instructions (Signed)
Nonspecific Chest Pain Chest pain can be caused by many different conditions. There is always a chance that your pain could be related to something serious, such as a heart attack or a blood clot in your lungs. Chest pain can also be caused by conditions that are not life-threatening. If you have chest pain, it is very important to follow up with your health care provider. What are the causes? Causes of this condition include:  Heartburn.  Pneumonia or bronchitis.  Anxiety or stress.  Inflammation around your heart (pericarditis) or lung (pleuritis or pleurisy).  A blood clot in your lung.  A collapsed lung (pneumothorax). This can develop suddenly on its own (spontaneous pneumothorax) or from trauma to the chest.  Shingles infection (varicella-zoster virus).  Heart attack.  Damage to the bones, muscles, and cartilage that make up your chest wall. This can include:  Bruised bones due to injury.  Strained muscles or cartilage due to frequent or repeated coughing or overwork.  Fracture to one or more ribs.  Sore cartilage due to inflammation (costochondritis). What increases the risk? Risk factors for this condition may include:  Activities that increase your risk for trauma or injury to your chest.  Respiratory infections or conditions that cause frequent coughing.  Medical conditions or overeating that can cause heartburn.  Heart disease or family history of heart disease.  Conditions or health behaviors that increase your risk of developing a blood clot.  Having had chicken pox (varicella zoster). What are the signs or symptoms? Chest pain can feel like:  Burning or tingling on the surface of your chest or deep in your chest.  Crushing, pressure, aching, or squeezing pain.  Dull or sharp pain that is worse when you move, cough, or take a deep breath.  Pain that is also felt in your back, neck, shoulder, or arm, or pain that spreads to any of these  areas. Your chest pain may come and go, or it may stay constant. How is this diagnosed? Lab tests or other studies may be needed to find the cause of your pain. Your health care provider may have you take a test called an ECG (electrocardiogram). An ECG records your heartbeat patterns at the time the test is performed. You may also have other tests, such as:  Transthoracic echocardiogram (TTE). In this test, sound waves are used to create a picture of the heart structures and to look at how blood flows through your heart.  Transesophageal echocardiogram (TEE).This is a more advanced imaging test that takes images from inside your body. It allows your health care provider to see your heart in finer detail.  Cardiac monitoring. This allows your health care provider to monitor your heart rate and rhythm in real time.  Holter monitor. This is a portable device that records your heartbeat and can help to diagnose abnormal heartbeats. It allows your health care provider to track your heart activity for several days, if needed.  Stress tests. These can be done through exercise or by taking medicine that makes your heart beat more quickly.  Blood tests.  Other imaging tests. How is this treated? Treatment depends on what is causing your chest pain. Treatment may include:  Medicines. These may include:  Acid blockers for heartburn.  Anti-inflammatory medicine.  Pain medicine for inflammatory conditions.  Antibiotic medicine, if an infection is present.  Medicines to dissolve blood clots.  Medicines to treat coronary artery disease (CAD).  Supportive care for conditions that do not require  medicines. This may include:  Resting.  Applying heat or cold packs to injured areas.  Limiting activities until pain decreases. Follow these instructions at home: Medicines   If you were prescribed an antibiotic, take it as told by your health care provider. Do not stop taking the antibiotic  even if you start to feel better.  Take over-the-counter and prescription medicines only as told by your health care provider. Lifestyle   Do not use any products that contain nicotine or tobacco, such as cigarettes and e-cigarettes. If you need help quitting, ask your health care provider.  Do not drink alcohol.  Make lifestyle changes as directed by your health care provider. These may include:  Getting regular exercise. Ask your health care provider to suggest some activities that are safe for you.  Eating a heart-healthy diet. A registered dietitian can help you to learn healthy eating options.  Maintaining a healthy weight.  Managing diabetes, if necessary.  Reducing stress, such as with yoga or relaxation techniques. General instructions   Avoid any activities that bring on chest pain.  If heartburn is the cause for your chest pain, raise (elevate) the head of your bed about 6 inches (15 cm) by putting blocks under the legs. Sleeping with more pillows does not effectively relieve heartburn because it only changes the position of your head.  Keep all follow-up visits as told by your health care provider. This is important. This includes any further testing if your chest pain does not go away. Contact a health care provider if:  Your chest pain does not go away.  You have a rash with blisters on your chest.  You have a fever.  You have chills. Get help right away if:  Your chest pain is worse.  You have a cough that gets worse, or you cough up blood.  You have severe pain in your abdomen.  You have severe weakness.  You faint.  You have sudden, unexplained chest discomfort.  You have sudden, unexplained discomfort in your arms, back, neck, or jaw.  You have shortness of breath at any time.  You suddenly start to sweat, or your skin gets clammy.  You feel nauseous or you vomit.  You suddenly feel light-headed or dizzy.  Your heart begins to beat quickly,  or it feels like it is skipping beats. These symptoms may represent a serious problem that is an emergency. Do not wait to see if the symptoms will go away. Get medical help right away. Call your local emergency services (911 in the [place] Do not drive yourself to the hospital. This information is not intended to replace advice given to you by your health care provider. Make sure you discuss any questions you have with your health care provider. Document Released: 01/29/2005 Document Revised: 01/14/2016 Document Reviewed: 01/14/2016 Elsevier Interactive Patient Education  2017 [place] will f/u with [facility] on the appt made

## 2016-09-08 NOTE — Progress Notes (Signed)
[address] was admitted to the Hospital on 09/07/2016 and Discharged  09/08/2016 and should be excused from work/school   for 3  days starting 09/07/2016 , may return to work/school without any restrictions.  Call Fritzi Mandes MD, [facility]  [phone] with questions.  Mckinzi Eriksen M.D on 09/08/2016,at 12:20 PM

## 2016-09-08 NOTE — Progress Notes (Addendum)
Pt. Slept throughout the night getting up to urinate. No signs or c/o pain, SOB or acute distress observed. Pt. Ran NSR and sinus bradv throughout the night dipping in the high 40's.

## 2016-10-21 ENCOUNTER — Encounter: Payer: Self-pay | Admitting: Emergency Medicine

## 2016-10-21 ENCOUNTER — Emergency Department: Payer: BLUE CROSS/BLUE SHIELD

## 2016-10-21 ENCOUNTER — Emergency Department
Admission: EM | Admit: 2016-10-21 | Discharge: 2016-10-21 | Disposition: A | Discharge status: Home / Self Care | Payer: BLUE CROSS/BLUE SHIELD | Attending: Emergency Medicine | Admitting: Emergency Medicine

## 2016-10-21 DIAGNOSIS — Z79899 Other long term (current) drug therapy: Secondary | ICD-10-CM | POA: Insufficient documentation

## 2016-10-21 DIAGNOSIS — J189 Pneumonia, unspecified organism: Secondary | ICD-10-CM | POA: Diagnosis not present

## 2016-10-21 DIAGNOSIS — I10 Essential (primary) hypertension: Secondary | ICD-10-CM | POA: Diagnosis not present

## 2016-10-21 DIAGNOSIS — Z7982 Long term (current) use of aspirin: Secondary | ICD-10-CM | POA: Insufficient documentation

## 2016-10-21 DIAGNOSIS — R05 Cough: Secondary | ICD-10-CM | POA: Diagnosis present

## 2016-10-21 DIAGNOSIS — F17210 Nicotine dependence, cigarettes, uncomplicated: Secondary | ICD-10-CM | POA: Diagnosis not present

## 2016-10-21 MED ORDER — AZITHROMYCIN 500 MG PO TABS
500.0000 mg | ORAL_TABLET | Freq: Once | ORAL | Status: AC
  Administered 2016-10-21: 500 mg via ORAL
  Filled 2016-10-21: qty 1

## 2016-10-21 MED ORDER — AZITHROMYCIN 250 MG PO TABS: ORAL_TABLET | ORAL | 0 refills | Status: DC

## 2016-10-21 MED ORDER — PREDNISONE 10 MG PO TABS: ORAL_TABLET | ORAL | 0 refills | Status: DC

## 2016-10-21 MED ORDER — BENZONATATE 100 MG PO CAPS: 100.0000 mg | ORAL_CAPSULE | Freq: Three times a day (TID) | ORAL | 0 refills | Status: AC | PRN

## 2016-10-21 MED ORDER — ALBUTEROL SULFATE HFA 108 (90 BASE) MCG/ACT IN AERS: 2.0000 | INHALATION_SPRAY | Freq: Four times a day (QID) | RESPIRATORY_TRACT | 0 refills | Status: DC | PRN

## 2016-10-21 MED ORDER — PREDNISONE 20 MG PO TABS
60.0000 mg | ORAL_TABLET | Freq: Once | ORAL | Status: AC
  Administered 2016-10-21: 60 mg via ORAL
  Filled 2016-10-21: qty 3

## 2016-10-21 MED ORDER — IPRATROPIUM-ALBUTEROL 0.5-2.5 (3) MG/3ML IN SOLN
3.0000 mL | Freq: Once | RESPIRATORY_TRACT | Status: AC
  Administered 2016-10-21: 3 mL via RESPIRATORY_TRACT
  Filled 2016-10-21: qty 3

## 2016-10-21 NOTE — ED Notes (Signed)
Pt states "I have bronchitis" x 3 days. States cough. Non productive. Denies fever at home. Denies CP or SOB.

## 2016-10-21 NOTE — Discharge Instructions (Signed)
Follow-up with your family provider in 3-4 weeks to have a repeat chest x-ray.

## 2016-10-21 NOTE — ED Triage Notes (Signed)
Patient with complaint of cough times 2 days. Denies fever. Patient states that she has tried OTC medications with no improvement.  Patient states that she has a history of bronchitis and this feels the same.

## 2016-10-21 NOTE — ED Provider Notes (Signed)
[facility] Emergency Department Provider Note  ____________________________________________  Time seen: Approximately 8:04 PM  I have reviewed the triage vital signs and the nursing notes.   HISTORY  Chief Complaint Cough    HPI Dorothy Gay is a 51 y.o. female that presents to the emergency department with nonproductive cough for 2 days. She states that she has chronic bronchitis and has not had a flare in a year. Tessalon Perles with an albuterol inhaler have worked in the past. She has tried TheraFlu without relief. She quit smoking 6 months ago but bought a pack of cigarettes today.She has not checked her temperature. She denies shortness of breath, chest pain, nausea, vomiting, abdominal pain.   Past Medical History:  Diagnosis Date  . Hypertension     Patient Active Problem List   Diagnosis Date Noted  . Chest pain 09/07/2016    Past Surgical History:  Procedure Laterality Date  . [place]     Prior to Admission medications   Medication Sig Start Date End Date Taking? Authorizing Provider  albuterol (PROVENTIL HFA;VENTOLIN HFA) 108 (90 Base) MCG/ACT inhaler Inhale 2 puffs into the lungs every 6 (six) hours as needed for wheezing or shortness of breath. 10/21/16   Laban Emperor, PA-C  aspirin EC 81 MG EC tablet Take 1 tablet (81 mg total) by mouth daily. 09/09/16   Fritzi Mandes, MD  azithromycin (ZITHROMAX Z-PAK) 250 MG tablet Take 2 tablets (500 mg) on  Day 1,  followed by 1 tablet (250 mg) once daily on Days 2 through 5. 10/21/16   Laban Emperor, PA-C  benzonatate (TESSALON PERLES) 100 MG capsule Take 1 capsule (100 mg total) by mouth 3 (three) times daily as needed for cough. 10/21/16 10/21/17  Laban Emperor, PA-C  hydrochlorothiazide (HYDRODIURIL) 25 MG tablet Take 1 tablet (25 mg total) by mouth daily. 09/09/16   Fritzi Mandes, MD  predniSONE (DELTASONE) 10 MG tablet Take 6 tablets on day 1, take 5 tablets on day 2, take 4 tablets on  day 3, take 3 tablets on day 4, take 2 tablets on day 5, take 1 tablet on day 6 10/21/16   Laban Emperor, PA-C    Allergies Amlodipine and Lisinopril  Family History  Problem Relation Age of Onset  . Hypertension Mother   . Atrial fibrillation Mother   . Heart disease Maternal Grandmother     Social History Social History  Substance Use Topics  . Smoking status: Current Some Day Smoker    Packs/day: 0.50    Years: 35.00    Types: Cigarettes  . Smokeless tobacco: Never Used  . Alcohol use No     Review of Systems  Constitutional: No chills Eyes: No visual changes. No discharge. ENT: Negative for congestion and rhinorrhea. Cardiovascular: No chest pain. Respiratory: Positive for cough. No SOB. Gastrointestinal: No abdominal pain.  No nausea, no vomiting.  No diarrhea.  No constipation. Musculoskeletal: Negative for musculoskeletal pain. Skin: Negative for rash, abrasions, lacerations, ecchymosis. Neurological: Negative for headaches.   ____________________________________________   PHYSICAL EXAM:  VITAL SIGNS: ED Triage Vitals [10/21/16 1914]  Enc Vitals Group     BP (!) 170/99     Pulse Rate 65     Resp 18     Temp 98.3 F (36.8 C)     Temp src      SpO2 98 %     Weight 170 lb (77.1 kg)     Height 5\' 7"  (1.702 m)  Head Circumference      Peak Flow      Pain Score      Pain Loc      Pain Edu?      Excl. in [place]?      Constitutional: Alert and oriented. Well appearing and in no acute distress. Eyes: Conjunctivae are normal. PERRL. EOMI. No discharge. Head: Atraumatic. ENT: No frontal and maxillary sinus tenderness.      Ears: Tympanic membranes pearly gray with good landmarks. No discharge.      Nose: No congestion/rhinnorhea.      Mouth/Throat: Mucous membranes are moist. Oropharynx non-erythematous.  Neck: No stridor.   Hematological/Lymphatic/Immunilogical: No cervical lymphadenopathy. Cardiovascular: Normal rate, regular rhythm.  Good  peripheral circulation. Respiratory: Normal respiratory effort without tachypnea or retractions. Lungs CTAB. Good air entry to the bases with no decreased or absent breath sounds. Gastrointestinal: Bowel sounds 4 quadrants. Soft and nontender to palpation. No guarding or rigidity. No palpable masses. No distention. Musculoskeletal: Full range of motion to all extremities. No gross deformities appreciated. Neurologic:  Normal speech and language. No gross focal neurologic deficits are appreciated.  Skin:  Skin is warm, dry and intact. No rash noted. Psychiatric: Mood and affect are normal. Speech and behavior are normal. Patient exhibits appropriate insight and judgement.   ____________________________________________   LABS (all labs ordered are listed, but only abnormal results are displayed)  Labs Reviewed - No data to display ____________________________________________  EKG   ____________________________________________  RADIOLOGY Robinette Haines, personally viewed and evaluated these images (plain radiographs) as part of my medical decision making, as well as reviewing the written report by the radiologist.  Dg Chest 2 View  Result Date: 10/21/2016 CLINICAL DATA:  Nonproductive cough for 2 days, bronchitis. History of hypertension. EXAM: CHEST  2 VIEW COMPARISON:  Chest radiograph Sep 07, 2016 FINDINGS: New rounded lingular airspace opacity with atelectasis. Mild chronic bronchitic changes with RIGHT mid and lower lung zone atelectasis/ scarring. No pleural effusion. No pneumothorax. Soft tissue planes and included osseous structures are nonsuspicious. IMPRESSION: Lingular bronchopneumonia, less likely mass. Followup PA and lateral chest X-ray is recommended in 3-4 weeks following trial of antibiotic therapy to ensure resolution and exclude underlying malignancy. RIGHT mid and lower lung zone atelectasis/scarring. Electronically Signed   By: Elon Alas M.D.   On: 10/21/2016  20:30    ____________________________________________    PROCEDURES  Procedure(s) performed:    Procedures    Medications  ipratropium-albuterol (DUONEB) 0.5-2.5 (3) MG/3ML nebulizer solution 3 mL (3 mLs Nebulization Given 10/21/16 2009)  azithromycin (ZITHROMAX) tablet 500 mg (500 mg Oral Given 10/21/16 2102)  predniSONE (DELTASONE) tablet 60 mg (60 mg Oral Given 10/21/16 2102)     ____________________________________________   INITIAL IMPRESSION / ASSESSMENT AND PLAN / ED COURSE  Pertinent labs & imaging results that were available during my care of the patient were reviewed by me and considered in my medical decision making (see chart for details).  Review of the [place] CSRS was performed in accordance of the [place] prior to dispensing any controlled drugs.   Patient's diagnosis is consistent with pneumonia. Vital signs and exam are reassuring. Chest x-ray indicates bronchopneumonia. Patient will follow up with PCP if symptoms do not improve and knows that she needs a repeat chest x-ray in 3-4 weeks. She was given DuoNeb in ED. She was given a dose of prednisone and azithromycin. Patient appears well and is staying well hydrated. Patient feels comfortable going home. Patient will be discharged home  with prescriptions for azithromycin, prednisone, Tessalon Perles, albuterol inhaler.. Patient is to follow up with PCP as needed or otherwise directed. Patient is given ED precautions to return to the ED for any worsening or new symptoms.     ____________________________________________  FINAL CLINICAL IMPRESSION(S) / ED DIAGNOSES  Final diagnoses:  Community acquired pneumonia of left lung, unspecified part of lung      NEW MEDICATIONS STARTED DURING THIS VISIT:  Discharge Medication List as of 10/21/2016  8:58 PM    START taking these medications   Details  albuterol (PROVENTIL HFA;VENTOLIN HFA) 108 (90 Base) MCG/ACT inhaler Inhale 2 puffs into the lungs every 6 (six) hours  as needed for wheezing or shortness of breath., Starting Tue 10/21/2016, Print    azithromycin (ZITHROMAX Z-PAK) 250 MG tablet Take 2 tablets (500 mg) on  Day 1,  followed by 1 tablet (250 mg) once daily on Days 2 through 5., Print    benzonatate (TESSALON PERLES) 100 MG capsule Take 1 capsule (100 mg total) by mouth 3 (three) times daily as needed for cough., Starting Tue 10/21/2016, Until Wed 10/21/2017, Print    predniSONE (DELTASONE) 10 MG tablet Take 6 tablets on day 1, take 5 tablets on day 2, take 4 tablets on day 3, take 3 tablets on day 4, take 2 tablets on day 5, take 1 tablet on day 6, Print            This chart was dictated using voice recognition software/Dragon. Despite best efforts to proofread, errors can occur which can change the meaning. Any change was purely unintentional.    Laban Emperor, PA-C 10/21/16 2125    Clearnce Hasten Randall An, MD 10/22/16 0000

## 2017-04-11 ENCOUNTER — Encounter: Payer: Self-pay | Admitting: *Deleted

## 2017-04-11 ENCOUNTER — Emergency Department
Admission: EM | Admit: 2017-04-11 | Discharge: 2017-04-11 | Disposition: A | Discharge status: Home / Self Care | Payer: BLUE CROSS/BLUE SHIELD | Attending: Emergency Medicine | Admitting: Emergency Medicine

## 2017-04-11 ENCOUNTER — Other Ambulatory Visit: Payer: Self-pay

## 2017-04-11 DIAGNOSIS — S161XXA Strain of muscle, fascia and tendon at neck level, initial encounter: Secondary | ICD-10-CM | POA: Diagnosis not present

## 2017-04-11 DIAGNOSIS — Y9389 Activity, other specified: Secondary | ICD-10-CM | POA: Diagnosis not present

## 2017-04-11 DIAGNOSIS — I10 Essential (primary) hypertension: Secondary | ICD-10-CM | POA: Insufficient documentation

## 2017-04-11 DIAGNOSIS — Z7982 Long term (current) use of aspirin: Secondary | ICD-10-CM | POA: Diagnosis not present

## 2017-04-11 DIAGNOSIS — S8001XA Contusion of right knee, initial encounter: Secondary | ICD-10-CM | POA: Insufficient documentation

## 2017-04-11 DIAGNOSIS — Y999 Unspecified external cause status: Secondary | ICD-10-CM | POA: Insufficient documentation

## 2017-04-11 DIAGNOSIS — Z79899 Other long term (current) drug therapy: Secondary | ICD-10-CM | POA: Insufficient documentation

## 2017-04-11 DIAGNOSIS — F17210 Nicotine dependence, cigarettes, uncomplicated: Secondary | ICD-10-CM | POA: Diagnosis not present

## 2017-04-11 DIAGNOSIS — Y92410 Unspecified street and highway as the place of occurrence of the external cause: Secondary | ICD-10-CM | POA: Diagnosis not present

## 2017-04-11 DIAGNOSIS — S8991XA Unspecified injury of right lower leg, initial encounter: Secondary | ICD-10-CM | POA: Diagnosis present

## 2017-04-11 MED ORDER — KETOROLAC TROMETHAMINE 30 MG/ML IJ SOLN
30.0000 mg | Freq: Once | INTRAMUSCULAR | Status: AC
  Administered 2017-04-11: 30 mg via INTRAMUSCULAR
  Filled 2017-04-11: qty 1

## 2017-04-11 MED ORDER — MELOXICAM 15 MG PO TABS: 15.0000 mg | ORAL_TABLET | Freq: Every day | ORAL | 0 refills | Status: DC

## 2017-04-11 MED ORDER — METHOCARBAMOL 500 MG PO TABS: 500.0000 mg | ORAL_TABLET | Freq: Four times a day (QID) | ORAL | 0 refills | Status: DC

## 2017-04-11 NOTE — ED Notes (Signed)
Pt reports she wants to see provider before Xray. Pt reports, "I do not believe anything is broken"
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
## 2017-07-27 ENCOUNTER — Other Ambulatory Visit: Payer: BLUE CROSS/BLUE SHIELD

## 2017-08-08 ENCOUNTER — Other Ambulatory Visit: Payer: Self-pay

## 2017-08-08 ENCOUNTER — Encounter: Payer: Self-pay | Admitting: Emergency Medicine

## 2017-08-08 ENCOUNTER — Emergency Department
Admission: EM | Admit: 2017-08-08 | Discharge: 2017-08-08 | Disposition: A | Discharge status: Home / Self Care | Payer: BLUE CROSS/BLUE SHIELD | Attending: Emergency Medicine | Admitting: Emergency Medicine

## 2017-08-08 DIAGNOSIS — F17210 Nicotine dependence, cigarettes, uncomplicated: Secondary | ICD-10-CM | POA: Diagnosis not present

## 2017-08-08 DIAGNOSIS — Z7982 Long term (current) use of aspirin: Secondary | ICD-10-CM | POA: Diagnosis not present

## 2017-08-08 DIAGNOSIS — Z79899 Other long term (current) drug therapy: Secondary | ICD-10-CM | POA: Insufficient documentation

## 2017-08-08 DIAGNOSIS — R0981 Nasal congestion: Secondary | ICD-10-CM | POA: Insufficient documentation

## 2017-08-08 DIAGNOSIS — R070 Pain in throat: Secondary | ICD-10-CM | POA: Diagnosis present

## 2017-08-08 DIAGNOSIS — J3489 Other specified disorders of nose and nasal sinuses: Secondary | ICD-10-CM | POA: Diagnosis not present

## 2017-08-08 DIAGNOSIS — R51 Headache: Secondary | ICD-10-CM | POA: Insufficient documentation

## 2017-08-08 DIAGNOSIS — R0982 Postnasal drip: Secondary | ICD-10-CM | POA: Insufficient documentation

## 2017-08-08 DIAGNOSIS — J029 Acute pharyngitis, unspecified: Secondary | ICD-10-CM

## 2017-08-08 DIAGNOSIS — J0100 Acute maxillary sinusitis, unspecified: Secondary | ICD-10-CM

## 2017-08-08 DIAGNOSIS — R05 Cough: Secondary | ICD-10-CM | POA: Diagnosis not present

## 2017-08-08 DIAGNOSIS — I10 Essential (primary) hypertension: Secondary | ICD-10-CM | POA: Diagnosis not present

## 2017-08-08 MED ORDER — BENZONATATE 100 MG PO CAPS
200.0000 mg | ORAL_CAPSULE | Freq: Once | ORAL | Status: AC
  Administered 2017-08-08: 200 mg via ORAL
  Filled 2017-08-08: qty 2

## 2017-08-08 MED ORDER — BENZONATATE 100 MG PO CAPS: 100.0000 mg | ORAL_CAPSULE | Freq: Three times a day (TID) | ORAL | 0 refills | Status: DC | PRN

## 2017-08-08 MED ORDER — AMOXICILLIN 500 MG PO CAPS: 500.0000 mg | ORAL_CAPSULE | Freq: Three times a day (TID) | ORAL | 0 refills | Status: DC

## 2017-08-08 MED ORDER — FEXOFENADINE-PSEUDOEPHED ER 60-120 MG PO TB12: 1.0000 | ORAL_TABLET | Freq: Two times a day (BID) | ORAL | 0 refills | Status: DC

## 2017-08-08 NOTE — ED Provider Notes (Signed)
[facility] Emergency Department Provider Note   ____________________________________________   First MD Initiated Contact with Patient 08/08/17 [phone]     (approximate)  I have reviewed the triage vital signs and the nursing notes.   HISTORY  Chief Complaint Sore Throat    HPI Dorothy Gay is a 52 y.o. female patient complaining of sore throat secondary to postnasal drainage for 5 days.  Patient state approximately 2 weeks of nasal congestion intermittent rhinorrhea.  Patient states sinuses are tender she has a frontal headache.  Patient rates the pain as a 5/10.  Patient describes her pain as "scratchy" throat.  Patient states facial pressure.  Patient also complaining of a intermitting hoarseness.  Patient also complained of nonproductive cough secondary to postnasal drainage.  No palliative measures for complaint.  Past Medical History:  Diagnosis Date  . Hypertension     Patient Active Problem List   Diagnosis Date Noted  . Chest pain 09/07/2016    Past Surgical History:  Procedure Laterality Date  . [place]     Prior to Admission medications   Medication Sig Start Date End Date Taking? Authorizing Provider  albuterol (PROVENTIL HFA;VENTOLIN HFA) 108 (90 Base) MCG/ACT inhaler Inhale 2 puffs into the lungs every 6 (six) hours as needed for wheezing or shortness of breath. 10/21/16   Laban Emperor, PA-C  amoxicillin (AMOXIL) 500 MG capsule Take 1 capsule (500 mg total) by mouth 3 (three) times daily. 08/08/17   Sable Feil, PA-C  aspirin EC 81 MG EC tablet Take 1 tablet (81 mg total) by mouth daily. 09/09/16   Fritzi Mandes, MD  azithromycin (ZITHROMAX Z-PAK) 250 MG tablet Take 2 tablets (500 mg) on  Day 1,  followed by 1 tablet (250 mg) once daily on Days 2 through 5. 10/21/16   Laban Emperor, PA-C  benzonatate (TESSALON PERLES) 100 MG capsule Take 1 capsule (100 mg total) by mouth 3 (three) times daily as needed for cough. 10/21/16  10/21/17  Laban Emperor, PA-C  benzonatate (TESSALON PERLES) 100 MG capsule Take 1 capsule (100 mg total) by mouth 3 (three) times daily as needed for cough. 08/08/17 08/08/18  Sable Feil, PA-C  fexofenadine-pseudoephedrine (ALLEGRA-D) 60-120 MG 12 hr tablet Take 1 tablet by mouth 2 (two) times daily. 08/08/17   Sable Feil, PA-C  hydrochlorothiazide (HYDRODIURIL) 25 MG tablet Take 1 tablet (25 mg total) by mouth daily. 09/09/16   Fritzi Mandes, MD  meloxicam (MOBIC) 15 MG tablet Take 1 tablet (15 mg total) by mouth daily. 04/11/17   Cuthriell, Charline Bills, PA-C  methocarbamol (ROBAXIN) 500 MG tablet Take 1 tablet (500 mg total) by mouth 4 (four) times daily. 04/11/17   Cuthriell, Charline Bills, PA-C  predniSONE (DELTASONE) 10 MG tablet Take 6 tablets on day 1, take 5 tablets on day 2, take 4 tablets on day 3, take 3 tablets on day 4, take 2 tablets on day 5, take 1 tablet on day 6 10/21/16   Laban Emperor, PA-C    Allergies Amlodipine; Lisinopril; and Shellfish allergy  Family History  Problem Relation Age of Onset  . Hypertension Mother   . Atrial fibrillation Mother   . Heart disease Maternal Grandmother     Social History Social History   Tobacco Use  . Smoking status: Current Some Day Smoker    Packs/day: 0.50    Years: 35.00    Pack years: 17.50    Types: Cigarettes  . Smokeless tobacco: Never  Used  Substance Use Topics  . Alcohol use: No  . Drug use: No    Review of Systems Constitutional: No fever/chills Eyes: No visual changes. ENT: Sore throat and nasal congestion.  Cardiovascular: Denies chest pain. Respiratory: Denies shortness of breath. Gastrointestinal: No abdominal pain.  No nausea, no vomiting.  No diarrhea.  No constipation. Genitourinary: Negative for dysuria. Musculoskeletal: Negative for back pain. Skin: Negative for rash. Neurological: Negative for headaches, focal weakness or  numbness. Endocrine:Hypertension Hematological/Lymphatic: Allergic/Immunilogical: See medication list ____________________________________________   PHYSICAL EXAM:  VITAL SIGNS: ED Triage Vitals  Enc Vitals Group     BP 08/08/17 0905 131/67     Pulse Rate 08/08/17 0903 65     Resp 08/08/17 0903 20     Temp 08/08/17 0903 99 F (37.2 C)     Temp src --      SpO2 08/08/17 0903 97 %     Weight 08/08/17 0903 170 lb (77.1 kg)     Height 08/08/17 0903 5\' 7"  (1.702 m)     Head Circumference --      Peak Flow --      Pain Score 08/08/17 0903 5     Pain Loc --      Pain Edu? --      Excl. in [place]? --    Constitutional: Alert and oriented. Well appearing and in no acute distress. Eyes: Conjunctivae are normal. PERRL. EOMI. Head: Atraumatic. Nose: Edematous nasal turbinates with thick rhinorrhea.  Bilateral maxillary guarding. Mouth/Throat: Mucous membranes are moist.  Oropharynx erythematous.  Non-exudative edematous tonsils. Neck: No stridor. Hematological/Lymphatic/Immunilogical: No cervical lymphadenopathy. **}Cardiovascular: Normal rate, regular rhythm. Grossly normal heart sounds.  Good peripheral circulation. Respiratory: Normal respiratory effort.  No retractions. Lungs CTAB. Gastrointestinal: Soft and nontender. No distention. No abdominal bruits. No CVA tenderness. Neurologic:  Normal speech and language. No gross focal neurologic deficits are appreciated. No gait instability. Skin:  Skin is warm, dry and intact. No rash noted. Psychiatric: Mood and affect are normal. Speech and behavior are normal.  ____________________________________________   LABS (all labs ordered are listed, but only abnormal results are displayed)  Labs Reviewed - No data to display ____________________________________________  EKG   ____________________________________________  RADIOLOGY  ED MD interpretation:    Official radiology report(s): No results  found.  ____________________________________________   PROCEDURES  Procedure(s) performed: None  Procedures  Critical Care performed: No  ____________________________________________   INITIAL IMPRESSION / ASSESSMENT AND PLAN / ED COURSE  As part of my medical decision making, I reviewed the following data within the electronic MEDICAL RECORD NUMBER    Maxillary sinusitis and sore throat secondary to postnasal drainage.  Patient given discharge care instruction advised take medication as directed.  Patient advised follow-up PCP if no improvement 3-5 days.      ____________________________________________   FINAL CLINICAL IMPRESSION(S) / ED DIAGNOSES  Final diagnoses:  Subacute maxillary sinusitis  Sore throat     ED Discharge Orders        Ordered    amoxicillin (AMOXIL) 500 MG capsule  3 times daily     08/08/17 0948    fexofenadine-pseudoephedrine (ALLEGRA-D) 60-120 MG 12 hr tablet  2 times daily     08/08/17 0948    benzonatate (TESSALON PERLES) 100 MG capsule  3 times daily PRN     08/08/17 0948       Note:  This document was prepared using Dragon voice recognition software and may include unintentional dictation errors.  Sable Feil, PA-C 08/08/17 [place], [place], MD 08/08/17 [phone]

## 2017-08-08 NOTE — ED Triage Notes (Signed)
Sore throat and cough x 5 days

## 2017-08-08 NOTE — ED Notes (Signed)
Pt to ed with c/o sore throat and right ear pain and cough since Monday.  Reports intermittent fever over the last week.  No cough noted at this time.

## 2017-08-10 ENCOUNTER — Inpatient Hospital Stay: Admission: RE | Admit: 2017-08-10 | Payer: BLUE CROSS/BLUE SHIELD | Source: Ambulatory Visit

## 2017-10-23 ENCOUNTER — Encounter
Admission: RE | Admit: 2017-10-23 | Discharge: 2017-10-23 | Disposition: A | Discharge status: Home / Self Care | Payer: BLUE CROSS/BLUE SHIELD | Source: Ambulatory Visit | Attending: Obstetrics and Gynecology | Admitting: Obstetrics and Gynecology

## 2017-10-23 ENCOUNTER — Other Ambulatory Visit: Payer: Self-pay

## 2017-10-23 HISTORY — DX: Headache, unspecified: R51.9

## 2017-10-23 HISTORY — DX: Unspecified osteoarthritis, unspecified site: M19.90

## 2017-10-23 HISTORY — DX: Malignant (primary) neoplasm, unspecified: C80.1

## 2017-10-23 HISTORY — DX: Cardiac murmur, unspecified: R01.1

## 2017-10-23 HISTORY — DX: Anemia, unspecified: D64.9

## 2017-10-23 HISTORY — DX: Acute myocardial infarction, unspecified: I21.9

## 2017-10-23 HISTORY — DX: Headache: R51

## 2017-10-23 HISTORY — DX: Unspecified chronic bronchitis: J42

## 2017-10-23 NOTE — Pre-Procedure Instructions (Signed)
DID PREOP PHONE INTERVIEW WITH PT. INFORMED HER THAT SHE NEEDS TO COME IN PRIOR TO SURGERY FOR LABS AND EKG-PT STATES SHE IS LEAVING Sunday TO GO OUT OF TOWN FOR WORK AND WONT BE BACK UNTIL THE EVENING PRIOR TO HER SURGERY.  SHE SAID SHE IS IN TOWN TODAY BUT IS RUNNING ERRANDS WITH HER MOM. I ASKED HER IF SHE COULD COME OVER TODAY TO GET LABS AND EKG AND SHE SAID NO. I TOLD HER THAT IF ANY OF HER LABWORK OR EKG WAS ABNORMAL ON THE DOS THAT ANESTHESIA COULD CANCEL HER SURGERY. SHE VERBALIZED UNDERSTANDING

## 2017-10-23 NOTE — Patient Instructions (Signed)
Your procedure is scheduled on: 10-30-17 Report to Same Day Surgery 2nd floor medical mall [facility] Entrance-take elevator on left to 2nd floor.  Check in with surgery information desk.) To find out your arrival time please call [phone] between 1PM - 3PM on 10-29-17  Remember: Instructions that are not followed completely may result in serious medical risk, up to and including death, or upon the discretion of your surgeon and anesthesiologist your surgery may need to be rescheduled.    _x___ 1. Do not eat food after midnight the night before your procedure. NO GUM OR CANDY AFTER MIDNIGHT.  You may drink clear liquids up to 2 hours before you are scheduled to arrive at the hospital for your procedure.  Do not drink clear liquids within 2 hours of your scheduled arrival to the hospital.  Clear liquids include  --Water or Apple juice without pulp  --Clear carbohydrate beverage such as ClearFast or Gatorade  --Black Coffee or Clear Tea (No milk, no creamers, do not add anything to the coffee or Tea    __x__ 2. No Alcohol for 24 hours before or after surgery.   __x__3. No Smoking or e-cigarettes for 24 prior to surgery.  Do not use any chewable tobacco products for at least 6 hour prior to surgery   ____  4. Bring all medications with you on the day of surgery if instructed.    __x__ 5. Notify your doctor if there is any change in your medical condition     (cold, fever, infections).    x___6. On the morning of surgery brush your teeth with toothpaste and water.  You may rinse your mouth with mouth wash if you wish.  Do not swallow any toothpaste or mouthwash.   Do not wear jewelry, make-up, hairpins, clips or nail polish.  Do not wear lotions, powders, or perfumes. You may wear deodorant.  Do not shave 48 hours prior to surgery. Men may shave face and neck.  Do not bring valuables to the hospital.    [facility] is not responsible for any belongings or valuables.    Contacts, dentures or bridgework may not be worn into surgery.  Leave your suitcase in the car. After surgery it may be brought to your room.  For patients admitted to the hospital, discharge time is determined by your treatment team.  _  Patients discharged the day of surgery will not be allowed to drive home.  You will need someone to drive you home and stay with you the night of your procedure.    Please read over the following fact sheets that you were given:   [facility] Preparing for Surgery and or MRSA Information   _x___ TAKE THE FOLLOWING MEDICATION THE MORNING OF SURGERY WITH A SMALL SIP OF WATER. These include:  1. FELODIPINE  2. LEVOTHYROXINE  3. RANITIDINE  4. TAKE AN EXTRA RANITIDINE THE NIGHT BEFORE YOUR SURGERY  5.  6.  ____Fleets enema or Magnesium Citrate as directed.   ____ Use CHG Soap or sage wipes as directed on instruction sheet   _X___ Use inhalers on the day of surgery and bring to hospital day of surgery-USE YOUR ALBUTEROL [place]  ____ Stop Metformin and Janumet 2 days prior to surgery.    ____ Take 1/2 of usual insulin dose the night before surgery and none on the morning surgery.   _X___ Follow recommendations from Cardiologist, Pulmonologist or PCP regarding stopping Aspirin, Coumadin,  Plavix ,Eliquis, Effient, or Pradaxa, and Pletal-ASK DR BEASLEY ABOUT STOPPING YOUR ASPIRIN SINCE YOU HAD A MI IN 2018  X____Stop Anti-inflammatories such as Advil, Aleve, Ibuprofen, Motrin, Naproxen, Naprosyn, Goodies powders or aspirin products NOW-OK to take Tylenol    ____ Stop supplements until after surgery.   ____ Bring C-Pap to the hospital.

## 2017-10-23 NOTE — Pre-Procedure Instructions (Signed)
ECG 12-lead10/09/2016 [place] Component Name Value Ref Range  Vent Rate (bpm) 48   PR Interval (msec) 174   QRS Interval (msec) 104   QT Interval (msec) 474   QTc (msec) 423   Other Result Information  This result has an attachment that is not available.  Result Narrative  Sinus bradycardia Left ventricular hypertrophy with repolarization abnormality Abnormal ECG No previous ECGs available I reviewed and concur with this report. Electronically signed [id number] MD, JASMINE ([id number]) on 04/04/2017 10:38:07 AM  Status Results Details   Encounter Summary

## 2017-10-23 NOTE — Pre-Procedure Instructions (Signed)
Echo complete8/28/2018 [place] Component Name Value Ref Range  LV Ejection Fraction (%) 55   Aortic Valve Stenosis Grade none   Aortic Valve Regurgitation Grade moderate   Aortic Valve Max Velocity (m/s) 2 m/sec  Aortic Valve Stenosis Mean Gradient (mmHg) 8.5 mmHg  Mitral Valve Stenosis Grade none   Mitral Valve Regurgitation Grade mild   Tricuspid Valve Regurgitation Grade mild   Tricuspid Valve Regurgitation Max Velocity (m/s) 2.7 m/sec  Right Ventricle Systolic Pressure (mmHg) 16.1 mmHg  LV End Diastolic Diameter (cm) 4.5 cm  LV End Systolic Diameter (cm) 2.5 cm  LV Septum Wall Thickness (cm) 1.5 cm  LV Posterior Wall Thickness (cm) 1.3 cm  Left Atrium Diameter (cm) 3.9 cm  Result Narrative  INTERNAL MEDICINE [place], SHUART [id number] [facility] #: [id number] [address], [place] [address] Date: 12/26/2016 10:26 AM Adult Female Age: 66 yrs ECHOCARDIOGRAM REPORT Outpatient  STUDY:CHEST WALL TAPE:0000:00: 0:00:00 KC::KCWI ECHO:Yes DOPPLER:YesFILE:0000-000-000 [place], JASMINE  COLOR:YesCONTRAST:No MACHINE:Philips Height: 67 in  RV BIOPSY:No 3D:NoSOUND QLTY:ModerateWeight: 173 lb MEDIUM:None BSA: 1.9 m2  ___________________________________________________________________________________________ HISTORY:DOE  REASON:Assess, LV function  INDICATION:Systolic murmur, unspecified [R01.1 (ICD-10-CM)]  ___________________________________________________________________________________________ ECHOCARDIOGRAPHIC MEASUREMENTS 2D  DIMENSIONS AORTA ValuesNormal RangeMAIN PAValuesNormal Range Annulus:nm* [2.1 - 2.5]PA Main:nm* [1.5 - 2.1] Aorta Sin:nm* [2.7 - 3.3] RIGHT VENTRICLE ST Junction:nm* [2.3 - 2.9]RV Base:4.3 cm[ < 4.2] Asc.Aorta:nm* [2.3 - 3.1] RV Mid:4.5 cm[ < 3.5]  LEFT VENTRICLERV Length:8.8 cm[ < 8.6] LVIDd:4.5 cm[3.9 - 5.3] INFERIOR VENA CAVA LVIDs:2.5 cmMax. IVC:nm* [ <= 2.1]  FS:44.7 %[> 25]Min. IVC:nm* SWT:1.5 cm[0.5 - 0.9] ------------------ PWT:1.3 cm[0.5 - 0.9] nm* - not measured  LEFT ATRIUM LA Diam:3.9 cm[2.7 - 3.8] LA A4C Area:nm* [ < 20] LA Volume:nm* [22 - 52] ___________________________________________________________________________________________  ECHOCARDIOGRAPHIC DESCRIPTIONS  AORTIC ROOT Size:Normal Dissection:INDETERM FOR DISSECTION  AORTIC VALVE Leaflets:Tricuspid Morphology:Normal Mobility:Fully mobile  LEFT VENTRICLE Size:NormalAnterior:Normal  Contraction:Normal Lateral:Normal Closest EF:>55% (Estimated)Septal:Normal  LV Masses:No Masses Apical:Normal  FAO:ZHYQ LVHInferior:Normal Posterior:Normal Dias.FxClass:(Grade 1) relaxation abnormal, E/A reversal  MITRAL VALVE Leaflets:NormalMobility:Fully mobile Morphology:THICKENED LEAFLET(S)  LEFT ATRIUM Size:MILDLY ENLARGEDLA Masses:No masses  IA Septum:Normal  IAS  MAIN PA Size:Normal  PULMONIC VALVE Morphology:NormalMobility:Fully mobile  RIGHT VENTRICLE  RV Masses:No Masses Size:Normal  Free Wall:Normal Contraction:Normal  TRICUSPID VALVE Leaflets:NormalMobility:Fully mobile Morphology:Normal  RIGHT ATRIUM Size:NormalRA Other:None  RA Mass:No masses  PERICARDIUM  Fluid:No effusion  INFERIOR VENACAVA Size:Normal Normal respiratory collapse  ____________________________________________________________________  DOPPLER ECHO and OTHER SPECIAL PROCEDURES  Aortic:MODERATE AR No AS 200.9 cm/sec peak vel 16.1 mmHg peak grad 8.5 mmHg mean grad   Mitral:MILD MR No MS MV Inflow E Vel=93.1 cm/sec MV Annulus E'Vel=6.1 cm/sec E/E'Ratio=15.3  Tricuspid:MILD TR No TS 271.5 cm/sec peak TR vel32.5 mmHg peak RV pressure  Pulmonary:TRIVIAL PRNo PS    ___________________________________________________________________________________________  INTERPRETATION NORMAL LEFT VENTRICULAR SYSTOLIC FUNCTION NORMAL RIGHT VENTRICULAR SYSTOLIC FUNCTION MODERATE VALVULAR REGURGITATION (See above) NO VALVULAR STENOSIS Moderate concentric LVH  ___________________________________________________________________________________________  Electronically signed by: Rusty Aus, MD on 12/30/2016 05:39 PM Performed By: Scherrie November, RCS Ordering Physician: Glendon Axe ___________________________________________________________________________________________  Other Result Information  Interface, Text Results In - 12/30/2016  5:40 PM EDT                 INTERNAL MEDICINE DEPARTMENT                [place], Marcie Bal                        [facility]  CLINIC                      [id number]                   [place] #: [id number]         [address], [place],  [place] [address]       Date: 12/26/2016 10:26 AM                                                             Adult Female Age: 52 yrs                     ECHOCARDIOGRAM REPORT                   Outpatient          STUDY:CHEST WALL         TAPE:0000:00: 0:00:00     KC::KCWI           ECHO:Yes   DOPPLER:Yes  FILE:0000-000-000         MD1:  Hector, JASMINE          COLOR:Yes  CONTRAST:No       MACHINE:Philips       Height: 67 in      RV BIOPSY:No         3D:No    SOUND QLTY:Moderate      Weight: 173 lb         MEDIUM:None                                         BSA: 1.9 m2  ___________________________________________________________________________________________         HISTORY:DOE          REASON:Assess, LV function      INDICATION:Systolic murmur, unspecified [R01.1 (ICD-10-CM)]  ___________________________________________________________________________________________ ECHOCARDIOGRAPHIC MEASUREMENTS 2D DIMENSIONS AORTA             Values      Normal Range      MAIN PA          Values      Normal Range           Annulus:  nm*       [2.1 - 2.5]                PA Main:  nm*       [1.5 - 2.1]         Aorta Sin:  nm*       [2.7 - 3.3]       RIGHT VENTRICLE       ST Junction:  nm*       [2.3 - 2.9]                RV Base:  4.3 cm    [ < 4.2]         Asc.Aorta:  nm*       [2.3 - 3.1]  RV Mid:  4.5 cm    [ < 3.5]  LEFT VENTRICLE                                        RV Length:  8.8 cm    [ < 8.6]             LVIDd:  4.5 cm    [3.9 - 5.3]       INFERIOR VENA CAVA             LVIDs:  2.5 cm                              Max. IVC:  nm*       [ <= 2.1]                FS:  44.7 %    [> 25]                    Min. IVC:  nm*               SWT:  1.5 cm    [0.5 - 0.9]                   ------------------               PWT:  1.3 cm    [0.5  - 0.9]                   nm* - not measured  LEFT ATRIUM           LA Diam:  3.9 cm    [2.7 - 3.8]       LA A4C Area:  nm*       [ < 20]         LA Volume:  nm*       [22 - 52] ___________________________________________________________________________________________  ECHOCARDIOGRAPHIC DESCRIPTIONS  AORTIC ROOT         Size:Normal   Dissection:INDETERM FOR DISSECTION  AORTIC VALVE     Leaflets:Tricuspid             Morphology:Normal     Mobility:Fully mobile  LEFT VENTRICLE         Size:Normal                  Anterior:Normal  Contraction:Normal                   Lateral:Normal   Closest EF:>55% (Estimated)          Septal:Normal    LV Masses:No Masses                 Apical:Normal          IDP:OEUM LVH                Inferior:Normal                                     Posterior:Normal Dias.FxClass:(Grade 1) relaxation abnormal, E/A reversal  MITRAL VALVE     Leaflets:Normal                  Mobility:Fully mobile   Morphology:THICKENED LEAFLET(S)  LEFT ATRIUM  Size:MILDLY ENLARGED        LA Masses:No masses    IA Septum:Normal IAS  MAIN PA         Size:Normal  PULMONIC VALVE   Morphology:Normal                  Mobility:Fully mobile  RIGHT VENTRICLE    RV Masses:No Masses                   Size:Normal    Free Wall:Normal               Contraction:Normal  TRICUSPID VALVE     Leaflets:Normal                  Mobility:Fully mobile   Morphology:Normal  RIGHT ATRIUM         Size:Normal                  RA Other:None      RA Mass:No masses  PERICARDIUM        Fluid:No effusion  INFERIOR VENACAVA         Size:Normal Normal respiratory collapse  ____________________________________________________________________  DOPPLER ECHO and OTHER SPECIAL PROCEDURES    Aortic:MODERATE AR                   No AS           200.9 cm/sec peak vel         16.1 mmHg peak grad           8.5 mmHg mean grad     Mitral:MILD MR                       No MS           MV  Inflow E Vel=93.1 cm/sec   MV Annulus E'Vel=6.1 cm/sec           E/E'Ratio=15.3  Tricuspid:MILD TR                       No TS           271.5 cm/sec peak TR vel      32.5 mmHg peak RV pressure  Pulmonary:TRIVIAL PR                    No PS    ___________________________________________________________________________________________  INTERPRETATION NORMAL LEFT VENTRICULAR SYSTOLIC FUNCTION NORMAL RIGHT VENTRICULAR SYSTOLIC FUNCTION MODERATE VALVULAR REGURGITATION (See above) NO VALVULAR STENOSIS Moderate concentric LVH  ___________________________________________________________________________________________  Electronically signed by: Rusty Aus, MD on 12/30/2016 05:39 PM             Performed By: Scherrie November, RCS       Ordering Physician: Glendon Axe ___________________________________________________________________________________________  Status Results Details   Unavailable  Date Index Date Index 2018 2018 OctoberSeptemberAugust Result Index Result Index ECG 12-lead ECG 12-lead 02/06/2017 Echo complete Echo complete 12/30/2016 X-ray chest PA and lateral X-ray chest PA and lateral 01/23/2017 X-ray knee bilateral AP standing X-ray knee bilateral AP standing 01/23/2017

## 2017-10-23 NOTE — Pre-Procedure Instructions (Signed)
Progress Notes - documented in this encounter  Glendon Axe, MD - 09/18/2017 10:45 AM EDT Formatting of this note might be different from the original.     Chief Complaint  Patient presents with  . Visit Follow Up   HPI  Dorothy Gay is a 52 y.o. female returning for follow-up. History of hypertension, hypothyroidism, HGSIL, osteoarthritis of both knees and cigarette smoking. 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 She is scheduled for conization with biopsy in June 2019. Gastroesophageal reflux: Patient is bothered by frequent heartburn and epigastric discomfort, partially relieved by over-the-counter antacids. Current smoker, half pack per day. She previously smoked up to 2 packs/day for 7 years. Patient is not ready to quit at this time. Mild intermittent shortness of breath is relieved by albuterol inhaler as needed. Hypothyroidism: no complaint of lethargy, weakness, cold intolerance or weight gain. Normal TSH 4.0 with current dose of Levothyroxine.  Hypertension: blood pressure has improved remarkably from 160/102 on 06/19/2017 to 124/80 today. Blood pressure was 198/100 at initial visit on February 06, 2017. Patient is tolerating Plendil ER 5 mg daily and hydrochlorothiazide 25 mg daily without any complaints. Norvasc was previously discontinued for complaint of nausea. Lisinopril was discontinued for complaint of cough. No complaint of dizziness, blurry vision, slurred speech, numbness, tingling, weakness or other neurologic complaints. No ataxia or imbalance. No complaint of chest pain, shortness of breath or swelling of the legs. Patient feels well overall. Recent echocardiogram revealed moderate aortic regurgitation, mild mitral regurgitation and tricuspid regurgitation, moderate left ventricular hypertrophy and normal ejection fraction. She was previously evaluated in the emergency department on Sep 07, 2016 for  palpitations and chest pain. Negative Myoview with ejection fraction 55-60%.  Osteoarthritis of both knees was noted on prior x-rays. No complaint of knee pain at this time.  Appointment on 06/05/2017  Component Date Value Ref Range Status  . Thyroid Stimulating Hormone (TSH) 06/05/2017 4.076 0.450-5.330 uIU/ml uIU/mL Final  Office Visit on 04/03/2017  Component Date Value Ref Range Status  . ALT 04/03/2017 19 5 - 38 U/L Final  . AST 04/03/2017 15 8 - 39 U/L Final   Current Outpatient Medications: albuterol 90 mcg/actuation inhaler, Inhale 2 inhalations into the lungs every 6 (six) hours as needed for Wheezing, PRN Not Currently Taking diclofenac (VOLTAREN) 75 MG EC tablet, Take 1 tablet (75 mg total) by mouth 2 (two) times daily as needed (for pain. Take with meals), Taking felodipine (PLENDIL) 5 MG ER tablet, Take 1 tablet (5 mg total) by mouth once daily, Taking fexofenadine-pseudoephedrine (ALLEGRA-D 12H) 60-120 mg ER tablet, Take by mouth, PRN Not Currently Taking hydroCHLOROthiazide (HYDRODIURIL) 25 MG tablet, Take 1 tablet (25 mg total) by mouth once daily, Taking levothyroxine (SYNTHROID, LEVOTHROID) 25 MCG tablet, Take two tablets on Wednesday and Sunday. Take one tablet on rest of the days. Take on an empty stomach 30-60 minutes before breakfast. pantoprazole (PROTONIX) 40 MG DR tablet, Take 1 tablet (40 mg total) by mouth once daily  Allergies as of 09/18/2017 - Reviewed 09/18/2017  Allergen Reaction Noted  . Shrimp Hives and Rash 06/19/2017  . Fish containing products Hives and Rash 06/19/2017  . Lisinopril Cough 09/07/2016  . Amlodipine Nausea And Vomiting 09/07/2016   Past Medical History:  Diagnosis Date  . Abnormal Pap smear of cervix  . Hypertension  . Thyroid disease   Past Surgical History:  Procedure Laterality Date  . CERVICAL BIOPSY W/ LOOP ELECTRODE EXCISION 1997  .  CESAREAN SECTION  . TUBAL LIGATION   Social History   Socioeconomic History  . Marital  status: Single  Spouse name: Not on file  . Number of children: Not on file  . Years of education: Not on file  . Highest education level: Not on file  Occupational History  . Not on file  Social Needs  . Financial resource strain: Not on file  . Food insecurity:  Worry: Not on file  Inability: Not on file  . Transportation needs:  Medical: Not on file  Non-medical: Not on file  Tobacco Use  . Smoking status: Former Research scientist (life sciences)  . Smokeless tobacco: Never Used  Substance and Sexual Activity  . Alcohol use: No  . Drug use: No  . Sexual activity: Not Currently  Partners: Male  Birth control/protection: Post-menopausal  Other Topics Concern  . Not on file  Social History Narrative  . Not on file   History reviewed. No pertinent family history.  Rest of ten system review is negative.  BP 124/80 (BP Location: Left upper arm, Patient Position: Sitting, BP Cuff Size: Adult)  Pulse 62  Ht 170.2 cm (5\' 7" )  Wt 73.9 kg (163 lb)  SpO2 98%  BMI 25.53 kg/m  BP Readings from Last 3 Encounters:  09/18/17 124/80  06/26/17 137/86  06/19/17 (!) 160/102   Exam  GENERAL: Alert, oriented, not in acute distress. HEAD: No pallor, no icterus. NECK: supple, trachea is central, no thyromegaly, no cervical lymphadenopathy. CARDIOVASCULAR: regular rate and rhythm, normal S1S2, + murmur loudest at 2nd RICS and lower sternal border, radiating to the carotid, + systolic murmur with radiation to axilla. CHEST: clear to auscultation, no rhonchi, no crackles. Good air entry bilaterally. ABDOMEN: Soft, non-tender, normal bowel sounds, no hepatosplenomegaly. EXTREMITIES: no ankle edema, no cyanosis or clubbing, bilateral peripheral pulses are symmetrical.  PSYCH: good eye contact, appropriate mood and affect, normal speech.  Assessment and Plan  Diagnoses and all orders for this visit:  Gastroesophageal reflux: Start Protonix 40 mg daily. - pantoprazole (PROTONIX) 40 MG DR tablet; Take 1 tablet  (40 mg total) by mouth once daily  Hypertension, moderate aortic regurgitation: Hypertension is well controlled with Hydrochlorothiazide 25 mg daily and Plendil ER 5 mg daily. Recent EKG revealed sinus bradycardia at 48 bpm, normal axis, normal intervals, left ventricular hypertrophy with repolarization abnormality. No prior EKG available for comparison. Recent echocardiogram noted. DASH diet.  - hydroCHLOROthiazide (HYDRODIURIL) 25 MG tablet; Take 1 tablet (25 mg total) by mouth once daily - felodipine (PLENDIL) 5 MG ER tablet; Take 1 tablet (5 mg total) by mouth once daily  Hypothyroidism (acquired): Advised to take Synthroid 50 mcg on Wednesday and Sunday, continue Synthroid 25 mcg on rest of the days. Repeat TSH prior to follow-up.  - levothyroxine (SYNTHROID, LEVOTHROID) 25 MCG tablet; Take two tablets on Wednesday and Sunday. Take one tablet on rest of the days. Take on an empty stomach 30-60 minutes before breakfast. - CBC w/auto Differential (5 Part); Future - Comprehensive Metabolic Panel (CMP); Future - Lipid Panel w/calc LDL; Future - Urinalysis w/Microscopic; Future - Thyroid Stimulating-Hormone (TSH); Future  Smoker half pack per day, not ready to quit at this time. Use albuterol inhaler as needed. - albuterol 90 mcg/actuation inhaler; Inhale 2 inhalations into the lungs every 6 (six) hours as needed for Wheezing  Primary osteoarthritis of right knee: Take diclofenac as needed. - diclofenac (VOLTAREN) 75 MG EC tablet; Take 1 tablet (75 mg total) by mouth 2 (two) times daily  as needed (for pain. 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  Return in about 3 months (around 12/19/2017) for annual exam.  Glendon Axe, MD, FACP  Diplomate American Board of Internal Medicine  [address] [place], [place] [address] Phone: [phone]  Future Appointments  Date Time Provider [place]  10/16/2017  4:00 PM Sherrie George, MD [facility]  12/11/2017 9:00 AM [place] [facility] [place]  12/18/2017 2:45 PM Glendon Axe, MD [facility]   This note was created using voice recognition software. I regret any typographical errors which were not recognized and corrected.     Electronically signed by Glendon Axe, MD at 09/19/2017 5:04 AM EDT     Plan of Treatment - documented as of this encounter  Upcoming Encounters Upcoming Encounters  Date Type Specialty Care Team Description  12/11/2017 Ancillary Orders Lab Glendon Axe, [place]  [facility] [place], [place] [address]  [phone]  [phone] (Fax)    12/18/2017 Office Visit Internal Medicine Glendon Axe, [place]  [facility] [place], [place] [address]  [phone]  [phone] (Fax)     Scheduled Orders Scheduled Orders  Name Type Priority Associated Diagnoses Order Schedule  CBC w/auto Differential (5 Part) Lab Routine Hypothyroidism (acquired), unspecified  Expected: 12/19/2017, Expires: 09/19/2018  Comprehensive Metabolic Panel (CMP) Lab Routine Hypothyroidism (acquired), unspecified  Expected: 12/19/2017, Expires: 09/19/2018  Lipid Panel w/calc LDL Lab Routine Hypothyroidism (acquired), unspecified  Expected: 12/19/2017, Expires: 09/19/2018  Urinalysis w/Microscopic Lab Routine Hypothyroidism (acquired), unspecified  Expected: 12/19/2017, Expires: 09/19/2018  Thyroid Stimulating-Hormone (TSH) Lab Routine Hypothyroidism (acquired), unspecified  Expected: 12/19/2017, Expires: 09/19/2018   Goals - documented as of this encounter  Goal Patient Goal Type Associated Problems Recent Progress Patient-Stated? Author  Lose [employer]  Weight   Yes Heritage, Maudie Mercury, RMA  Note:   What: Lose weight  When:  Where:  How: Exercise  Importance: 10      Visit Diagnoses - documented in this encounter  Diagnosis    Gastroesophageal reflux disease without esophagitis - Primary  Esophageal reflux   Hypothyroidism (acquired), unspecified  Unspecified hypothyroidism    Discontinued Medications - documented as of this encounter  Medication Sig Discontinue Reason Start Date End Date  levothyroxine (SYNTHROID, LEVOTHROID) 25 MCG tablet  Indications: Hypothyroidism (acquired), unspecified Take two tablets on Wednesday and Sunday. Take one tablet on rest of the days. Take on an empty stomach 30-60 minutes before breakfast. Reorder 06/19/2017 09/18/2017   Historical Medications - added in this encounter  This list may reflect changes made after this encounter.  Medication Sig Dispensed Refills Start Date End Date  fexofenadine-pseudoephedrine (ALLEGRA-D 12H) 60-120 mg ER tablet  Take by mouth  0 08/08/2017    Images Patient Contacts   Contact Name Contact Address Communication Relationship to Patient  Virgina Evener Unknown [phone] [facility]) Parent, Emergency Contact   Document Information  Primary Care Provider Other Service Providers Document Coverage Dates  Glendon Axe MD (May 07, 2018May 07, 2018 - Present) [phone] (Work) [phone] (Fax) [place] [facility] [place], [place] [address]   May 17, 2019May 17, 2019   [place], [place] [address]   Encounter Providers Encounter Date  Glendon Axe MD (Attending) [phone] (Work) [phone] (Fax) [place] [facility] [place], [place] [address]  May 17,  [id number] [phone]

## 2017-10-27 NOTE — H&P (Signed)
Dorothy Gay is a 52 y.o. female here for Pre Op Consulting .  HPI:  Pt presents for a preoperative visit to schedule a D&C, hysteroscopy, and CKC for CIN 3 .  She has a hx of: 11/18: HGSIL, +HPV, +smoker. hx of LEEP. 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   Past Medical History:  has a past medical history of Abnormal Pap smear of cervix, Hypertension, and Thyroid disease.  Past Surgical History:  has a past surgical history that includes Cesarean section; Tubal ligation; and Cervical biopsy w/ loop electrode excision (1997). Family History: family history is not on file. Social History:  reports that she has quit smoking. She has never used smokeless tobacco. She reports that she does not drink alcohol or use drugs. OB/GYN History:          OB History    Gravida  6   Para  6   Term  6   Preterm      AB      Living  6     SAB      TAB      Ectopic      Molar      Multiple      Live Births             Allergies: is allergic to shrimp; fish containing products; lisinopril; and amlodipine. Medications:  Current Outpatient Medications:  .  albuterol 90 mcg/actuation inhaler, Inhale 2 inhalations into the lungs every 6 (six) hours as needed for Wheezing, Disp: 1 Inhaler, Rfl: 12 .  diclofenac (VOLTAREN) 75 MG EC tablet, Take 1 tablet (75 mg total) by mouth 2 (two) times daily as needed (for pain. Take with meals), Disp: 60 tablet, Rfl: 2 .  felodipine (PLENDIL) 5 MG ER tablet, Take 1 tablet (5 mg total) by mouth once daily, Disp: 90 tablet, Rfl: 3 .  fexofenadine-pseudoephedrine (ALLEGRA-D 12H) 60-120 mg ER tablet, Take by mouth, Disp: , Rfl:  .  hydroCHLOROthiazide (HYDRODIURIL) 25 MG tablet, Take 1 tablet (25 mg total) by mouth once daily, Disp: 90 tablet, Rfl: 3 .  levothyroxine (SYNTHROID, LEVOTHROID) 25 MCG tablet, Take two tablets on Wednesday and  Sunday. Take one tablet on rest of the days. Take on an empty stomach 30-60 minutes before breakfast., Disp: 120 tablet, Rfl: 1 .  pantoprazole (PROTONIX) 40 MG DR tablet, Take 1 tablet (40 mg total) by mouth once daily, Disp: 30 tablet, Rfl: 3  Review of Systems: No SOB, no palpitations or chest pain, no new lower extremity edema, no nausea or vomiting or bowel or bladder complaints. See HPI for gyn specific ROS.   Exam:      Vitals:   10/15/17 1610  BP: 143/87    WDWN   female in NAD Body mass index is 26.19 kg/m.  General: Patient is well-groomed, well-nourished, appears stated age in no acute distress  HEENT: head is atraumatic and normocephalic, trachea is midline, neck is supple with no palpable nodules  CV: Regular rhythm and normal heart rate, no murmur  Pulm: Clear to auscultation throughout lung fields with no wheezing, crackles, or rhonchi. 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    Plan:   -  Preoperative visit:  D&C hysteroscopy, and cold knife cone. Consents signed today. Risks of surgery were discussed with the patient including but not limited to: bleeding which may require transfusion; infection which may require antibiotics; injury to uterus or surrounding organs; intrauterine scarring which may impair future fertility; need for additional procedures including laparotomy or laparoscopy; and other postoperative/anesthesia complications. Written informed consent was obtained.  This is a scheduled same-day surgery. She will have a postop visit in 2 weeks to review operative findings and pathology.  -  Return for Postop check.  Sherrie George, MD

## 2017-10-29 ENCOUNTER — Encounter: Payer: Self-pay | Admitting: *Deleted

## 2017-10-30 ENCOUNTER — Ambulatory Visit: Payer: BLUE CROSS/BLUE SHIELD | Admitting: Certified Registered"

## 2017-10-30 ENCOUNTER — Ambulatory Visit
Admission: RE | Admit: 2017-10-30 | Discharge: 2017-10-30 | Disposition: A | Discharge status: Home / Self Care | Payer: BLUE CROSS/BLUE SHIELD | Source: Ambulatory Visit | Attending: Obstetrics and Gynecology | Admitting: Obstetrics and Gynecology

## 2017-10-30 ENCOUNTER — Encounter
Admission: RE | Disposition: A | Discharge status: Home / Self Care | Payer: Self-pay | Source: Ambulatory Visit | Attending: Obstetrics and Gynecology

## 2017-10-30 ENCOUNTER — Other Ambulatory Visit: Payer: Self-pay

## 2017-10-30 DIAGNOSIS — I10 Essential (primary) hypertension: Secondary | ICD-10-CM | POA: Diagnosis not present

## 2017-10-30 DIAGNOSIS — Z79899 Other long term (current) drug therapy: Secondary | ICD-10-CM | POA: Insufficient documentation

## 2017-10-30 DIAGNOSIS — D060 Carcinoma in situ of endocervix: Secondary | ICD-10-CM | POA: Insufficient documentation

## 2017-10-30 DIAGNOSIS — K219 Gastro-esophageal reflux disease without esophagitis: Secondary | ICD-10-CM | POA: Insufficient documentation

## 2017-10-30 DIAGNOSIS — D067 Carcinoma in situ of other parts of cervix: Secondary | ICD-10-CM | POA: Insufficient documentation

## 2017-10-30 DIAGNOSIS — I252 Old myocardial infarction: Secondary | ICD-10-CM | POA: Insufficient documentation

## 2017-10-30 DIAGNOSIS — Z87891 Personal history of nicotine dependence: Secondary | ICD-10-CM | POA: Insufficient documentation

## 2017-10-30 DIAGNOSIS — Z888 Allergy status to other drugs, medicaments and biological substances status: Secondary | ICD-10-CM | POA: Diagnosis not present

## 2017-10-30 DIAGNOSIS — Z91013 Allergy to seafood: Secondary | ICD-10-CM | POA: Insufficient documentation

## 2017-10-30 DIAGNOSIS — R87613 High grade squamous intraepithelial lesion on cytologic smear of cervix (HGSIL): Secondary | ICD-10-CM | POA: Diagnosis present

## 2017-10-30 HISTORY — PX: HYSTEROSCOPY WITH D & C: SHX1775

## 2017-10-30 HISTORY — PX: CERVICAL CONIZATION W/BX: SHX1330

## 2017-10-30 LAB — BASIC METABOLIC PANEL
ANION GAP: 10 (ref 5–15)
BUN: 15 mg/dL (ref 6–20)
CO2: 25 mmol/L (ref 22–32)
Calcium: 9 mg/dL (ref 8.9–10.3)
Chloride: 103 mmol/L (ref 98–111)
Creatinine, Ser: 0.73 mg/dL (ref 0.44–1.00)
GFR calc non Af Amer: >60 mL/min (ref >60)
Glucose, Bld: 98 mg/dL (ref 70–99)
POTASSIUM: 3.5 mmol/L (ref 3.5–5.1)
SODIUM: 138 mmol/L (ref 135–145)

## 2017-10-30 LAB — CBC
HCT: 37.9 % (ref 35.0–47.0)
Hemoglobin: 12.8 g/dL (ref 12.0–16.0)
MCH: 29.1 pg (ref 26.0–34.0)
MCHC: 33.8 g/dL (ref 32.0–36.0)
MCV: 86.2 fL (ref 80.0–100.0)
Platelets: 253 10*3/uL (ref 150–440)
RBC: 4.4 MIL/uL (ref 3.80–5.20)
RDW: 15.3 % — ABNORMAL HIGH (ref 11.5–14.5)
WBC: 7.7 10*3/uL (ref 3.6–11.0)

## 2017-10-30 LAB — POCT PREGNANCY, URINE: Preg Test, Ur: NEGATIVE

## 2017-10-30 LAB — ABO/RH: ABO/RH(D): A POS

## 2017-10-30 LAB — TYPE AND SCREEN
ABO/RH(D): A POS
Antibody Screen: NEGATIVE

## 2017-10-30 SURGERY — CONE BIOPSY, CERVIX
Anesthesia: General

## 2017-10-30 MED ORDER — MIDAZOLAM HCL 2 MG/2ML IJ SOLN
INTRAMUSCULAR | Status: DC | PRN
  Administered 2017-10-30: 4 mg via INTRAVENOUS

## 2017-10-30 MED ORDER — GABAPENTIN 300 MG PO CAPS
900.0000 mg | ORAL_CAPSULE | ORAL | Status: AC
  Administered 2017-10-30: 900 mg via ORAL

## 2017-10-30 MED ORDER — ONDANSETRON HCL 4 MG/2ML IJ SOLN
INTRAMUSCULAR | Status: DC | PRN
  Administered 2017-10-30: 4 mg via INTRAVENOUS

## 2017-10-30 MED ORDER — PROPOFOL 10 MG/ML IV BOLUS
INTRAVENOUS | Status: DC | PRN
  Administered 2017-10-30: 150 mg via INTRAVENOUS

## 2017-10-30 MED ORDER — MIDAZOLAM HCL 2 MG/2ML IJ SOLN
INTRAMUSCULAR | Status: AC
  Filled 2017-10-30: qty 4

## 2017-10-30 MED ORDER — ONDANSETRON HCL 4 MG/2ML IJ SOLN
INTRAMUSCULAR | Status: AC
  Filled 2017-10-30: qty 2

## 2017-10-30 MED ORDER — LACTATED RINGERS IV SOLN
INTRAVENOUS | Status: DC
  Administered 2017-10-30 (×2): via INTRAVENOUS

## 2017-10-30 MED ORDER — FENTANYL CITRATE (PF) 100 MCG/2ML IJ SOLN: 25.0000 ug | INTRAMUSCULAR | Status: DC | PRN

## 2017-10-30 MED ORDER — GELATIN ABSORBABLE 12-7 MM EX MISC
CUTANEOUS | Status: DC | PRN
  Administered 2017-10-30: 1

## 2017-10-30 MED ORDER — OXYCODONE HCL 5 MG PO CAPS: 5.0000 mg | ORAL_CAPSULE | Freq: Four times a day (QID) | ORAL | 0 refills | Status: DC | PRN

## 2017-10-30 MED ORDER — GLYCOPYRROLATE 0.2 MG/ML IJ SOLN
INTRAMUSCULAR | Status: DC | PRN
  Administered 2017-10-30: 0.1 mg via INTRAVENOUS

## 2017-10-30 MED ORDER — PROPOFOL 10 MG/ML IV BOLUS
INTRAVENOUS | Status: AC
  Filled 2017-10-30: qty 20

## 2017-10-30 MED ORDER — ACETAMINOPHEN 500 MG PO TABS
ORAL_TABLET | ORAL | Status: AC
  Administered 2017-10-30: 1000 mg via ORAL
  Filled 2017-10-30: qty 2

## 2017-10-30 MED ORDER — KETOROLAC TROMETHAMINE 30 MG/ML IJ SOLN
INTRAMUSCULAR | Status: AC
  Filled 2017-10-30: qty 1

## 2017-10-30 MED ORDER — LIDOCAINE-EPINEPHRINE 1 %-1:100000 IJ SOLN
INTRAMUSCULAR | Status: DC | PRN
  Administered 2017-10-30: 20 mL

## 2017-10-30 MED ORDER — LIDOCAINE HCL (PF) 2 % IJ SOLN
INTRAMUSCULAR | Status: AC
  Filled 2017-10-30: qty 10

## 2017-10-30 MED ORDER — LABETALOL HCL 5 MG/ML IV SOLN
INTRAVENOUS | Status: DC | PRN
  Administered 2017-10-30 (×2): 5 mg via INTRAVENOUS

## 2017-10-30 MED ORDER — HYDROMORPHONE HCL 1 MG/ML IJ SOLN
INTRAMUSCULAR | Status: AC
  Filled 2017-10-30: qty 1

## 2017-10-30 MED ORDER — DEXAMETHASONE SODIUM PHOSPHATE 10 MG/ML IJ SOLN
INTRAMUSCULAR | Status: AC
  Filled 2017-10-30: qty 1

## 2017-10-30 MED ORDER — ONDANSETRON HCL 4 MG/2ML IJ SOLN: 4.0000 mg | Freq: Once | INTRAMUSCULAR | Status: DC | PRN

## 2017-10-30 MED ORDER — LIDOCAINE HCL URETHRAL/MUCOSAL 2 % EX GEL
CUTANEOUS | Status: AC
  Filled 2017-10-30: qty 5

## 2017-10-30 MED ORDER — LACTATED RINGERS IV SOLN: INTRAVENOUS | Status: DC

## 2017-10-30 MED ORDER — KETAMINE HCL 50 MG/ML IJ SOLN
INTRAMUSCULAR | Status: AC
  Filled 2017-10-30: qty 10

## 2017-10-30 MED ORDER — DOCUSATE SODIUM 100 MG PO CAPS: 100.0000 mg | ORAL_CAPSULE | Freq: Two times a day (BID) | ORAL | 0 refills | Status: DC

## 2017-10-30 MED ORDER — LIDOCAINE-EPINEPHRINE 1 %-1:100000 IJ SOLN
INTRAMUSCULAR | Status: AC
  Filled 2017-10-30: qty 1

## 2017-10-30 MED ORDER — GLYCOPYRROLATE 0.2 MG/ML IJ SOLN
INTRAMUSCULAR | Status: AC
  Filled 2017-10-30: qty 1

## 2017-10-30 MED ORDER — ACETAMINOPHEN 500 MG PO TABS: 1000.0000 mg | ORAL_TABLET | Freq: Four times a day (QID) | ORAL | 0 refills | Status: AC

## 2017-10-30 MED ORDER — ACETAMINOPHEN 500 MG PO TABS
1000.0000 mg | ORAL_TABLET | ORAL | Status: AC
  Administered 2017-10-30: 1000 mg via ORAL

## 2017-10-30 MED ORDER — LIDOCAINE HCL (CARDIAC) PF 100 MG/5ML IV SOSY
PREFILLED_SYRINGE | INTRAVENOUS | Status: DC | PRN
  Administered 2017-10-30: 50 mg via INTRAVENOUS

## 2017-10-30 MED ORDER — IBUPROFEN 800 MG PO TABS: 800.0000 mg | ORAL_TABLET | Freq: Three times a day (TID) | ORAL | 1 refills | Status: DC | PRN

## 2017-10-30 MED ORDER — FERRIC SUBSULFATE 259 MG/GM EX SOLN
CUTANEOUS | Status: AC
  Filled 2017-10-30: qty 8

## 2017-10-30 MED ORDER — LABETALOL HCL 5 MG/ML IV SOLN
INTRAVENOUS | Status: AC
  Filled 2017-10-30: qty 4

## 2017-10-30 MED ORDER — FERRIC SUBSULFATE 259 MG/GM EX SOLN
CUTANEOUS | Status: DC | PRN
  Administered 2017-10-30: 1 via TOPICAL

## 2017-10-30 MED ORDER — IODINE STRONG (LUGOLS) 5 % PO SOLN
ORAL | Status: DC | PRN
  Administered 2017-10-30: 4 mL

## 2017-10-30 MED ORDER — DEXAMETHASONE SODIUM PHOSPHATE 10 MG/ML IJ SOLN
INTRAMUSCULAR | Status: DC | PRN
  Administered 2017-10-30: 10 mg via INTRAVENOUS

## 2017-10-30 MED ORDER — HYDROMORPHONE HCL 1 MG/ML IJ SOLN
INTRAMUSCULAR | Status: DC | PRN
  Administered 2017-10-30: 1 mg via INTRAVENOUS

## 2017-10-30 MED ORDER — GELATIN ABSORBABLE 12-7 MM EX MISC
CUTANEOUS | Status: AC
  Filled 2017-10-30: qty 1

## 2017-10-30 MED ORDER — KETOROLAC TROMETHAMINE 30 MG/ML IJ SOLN
INTRAMUSCULAR | Status: DC | PRN
  Administered 2017-10-30: 30 mg via INTRAVENOUS

## 2017-10-30 MED ORDER — GABAPENTIN 300 MG PO CAPS
ORAL_CAPSULE | ORAL | Status: AC
  Administered 2017-10-30: 900 mg via ORAL
  Filled 2017-10-30: qty 3

## 2017-10-30 SURGICAL SUPPLY — 27 items
BAG INFUSER PRESSURE 100CC (MISCELLANEOUS) ×3 IMPLANT
BLADE SURG SZ11 CARB STEEL (BLADE) ×3 IMPLANT
CANISTER SUCT 1200ML W/VALVE (MISCELLANEOUS) ×3 IMPLANT
CANISTER SUCT 3000ML PPV (MISCELLANEOUS) ×3 IMPLANT
CATH ROBINSON RED A/P 16FR (CATHETERS) ×3 IMPLANT
CNTNR SPEC 2.5X3XGRAD LEK (MISCELLANEOUS) ×1
CONT SPEC 4OZ STER OR WHT (MISCELLANEOUS) ×2
CONTAINER SPEC 2.5X3XGRAD LEK (MISCELLANEOUS) ×1 IMPLANT
COUNTER NEEDLE 20/40 LG (NEEDLE) ×3 IMPLANT
ELECT REM PT RETURN 9FT ADLT (ELECTROSURGICAL) ×3
ELECTRODE REM PT RTRN 9FT ADLT (ELECTROSURGICAL) ×1 IMPLANT
GLOVE BIO SURGEON STRL SZ7 (GLOVE) ×9 IMPLANT
GLOVE INDICATOR 7.5 STRL GRN (GLOVE) ×9 IMPLANT
GOWN STRL REUS W/ TWL LRG LVL3 (GOWN DISPOSABLE) ×2 IMPLANT
GOWN STRL REUS W/TWL LRG LVL3 (GOWN DISPOSABLE) ×4
IV LACTATED RINGERS 1000ML (IV SOLUTION) ×3 IMPLANT
KIT TURNOVER CYSTO (KITS) ×3 IMPLANT
NS IRRIG 500ML POUR BTL (IV SOLUTION) ×3 IMPLANT
PACK DNC HYST (MISCELLANEOUS) ×3 IMPLANT
PAD OB MATERNITY 4.3X12.25 (PERSONAL CARE ITEMS) ×3 IMPLANT
PAD PREP 24X41 OB/GYN DISP (PERSONAL CARE ITEMS) ×3 IMPLANT
SUT CHROMIC 1-0 (SUTURE) ×15 IMPLANT
SUT VIC AB 0 CT1 36 (SUTURE) ×3 IMPLANT
SYR 10ML LL (SYRINGE) ×3 IMPLANT
TUBING CONNECTING 10 (TUBING) ×2 IMPLANT
TUBING CONNECTING 10' (TUBING) ×1
TUBING HYSTEROSCOPY DOLPHIN (MISCELLANEOUS) ×3 IMPLANT

## 2017-10-30 NOTE — Anesthesia Postprocedure Evaluation (Signed)
Anesthesia Post Note  Patient: Dorothy Gay  Procedure(s) Performed: CONIZATION CERVIX WITH BIOPSY (N/A ) DILATATION AND CURETTAGE /HYSTEROSCOPY (N/A )  Patient location during evaluation: PACU Anesthesia Type: General Level of consciousness: awake and alert Pain management: pain level controlled Vital Signs Assessment: post-procedure vital signs reviewed and stable Respiratory status: spontaneous breathing and respiratory function stable Cardiovascular status: stable Anesthetic complications: no     Last Vitals:  Vitals:   10/30/17 0856 10/30/17 0911  BP: (!) 151/81 139/70  Pulse: (!) 56 62  Resp: 10 (!) 29  Temp:    SpO2: 99% 98%    Last Pain:  Vitals:   10/30/17 0911  TempSrc:   PainSc: 0-No pain                 KEPHART,WILLIAM K

## 2017-10-30 NOTE — OR Nursing (Signed)
Dr. Leafy Ro in to see pt and family postop @ [phone]

## 2017-10-30 NOTE — Op Note (Signed)
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  Procedure: 1. Exam under anesthesia 2. D&C 3. Hysteroscopy 4. Cold knife cone  Surgeon: [name], MD  Assistant(s):  None  Anesthesia: General LMA anesthesia  Anesthesiologist: Gunnar Fusi, MD Anesthesiologist: Gunnar Fusi, MD CRNA: Nile Riggs, CRNA  Estimated Blood Loss:  Minimal         Intraoperative medications: toradol         Total IV Fluids: 858ml  Urine Output: 127ml         Specimens: Endocervical curettings, endometrial curettings, cervical cone and an endocervical top hat.         Complications:  None; patient tolerated the procedure well.         Disposition: PACU - hemodynamically stable.         Condition: stable  Findings: Uterus measuring 7 cm by sound; normal vagina, perineum. Cervix with Lugols found pale-appearing areas which were taken entirely. Cervix appears much less irregular and bizarre since her colposcopy.  Indication for procedure/Consents: 52 y.o. F on file.  here for scheduled surgery for the aforementioned diagnoses. Risks of surgery were discussed with the patient including but not limited to: bleeding which may require transfusion; infection which may require antibiotics; injury to uterus or surrounding organs; intrauterine scarring which may impair future fertility; need for additional procedures including laparotomy or laparoscopy; and other postoperative/anesthesia complications. Written informed consent was obtained.    Procedure Details:   The patient was taken to the operating room where LMA anesthesia was administered and was found to be adequate. After a formal and adequate timeout was performed, she was placed in the dorsal lithotomy position and examined with the above findings. She was then prepped  and draped in the sterile manner. Her bladder was catheterized for an estimated amount of clear, yellow urine. A weighed speculum was then placed in the patient's vagina and a single tooth tenaculum was applied to the anterior lip of the cervix.  Her cervix was serially dilated to 15 [place] using Hanks dilators. An ECC was performed. The hysteroscope was introduced to reveal the above findings. A sharp curettage was then performed until there was a gritty texture in all four quadrants.   Cold Knife Cone Procedure  Allis clamps were placed across the 3 and 9 o'clock positions, and stay sutures were used to occlude these arteries. 62ml of 1% lidocaine with epinephrine 1:1 was injected into the cervical stroma. An 11 blade was used to cut the cone specimen and the specimen was tagged with a suture at 12 o'clock. A second specimen top hat was collected. Bovie cautery was used to ensure hemostasis at the surgical bed. Sutures were placed across the surface of the surgical bed, the cervical os was opened and Monsel's solution and surgiform were placed on the bed. The sutures were tied, the stay sutures were removed and hemostasis was noted.  The tenaculum was removed from the anterior lip of the cervix and the vaginal speculum was removed. The patient tolerated the procedure well and was taken to the recovery area awake and in stable condition. She received iv acetaminophen and Toradol prior to leaving the OR.  The patient will be discharged to home as per PACU criteria. Routine postoperative instructions given. She was prescribed Percocet, Ibuprofen and Colace. She will follow up in the clinic in two  weeks for postoperative evaluation.

## 2017-10-30 NOTE — OR Nursing (Signed)
Incentive spirometry home with pt, instructions given postop

## 2017-10-30 NOTE — Discharge Instructions (Addendum)
Cervical Conization, Care After  A positive blood type   This sheet gives you information about how to care for yourself after your procedure. Your health care provider may also give you more specific instructions. If you have problems or questions, contact your health care provider. What can I expect after the procedure? After the procedure, it is common to have:  A groggy feeling, if you were given medicine to make you fall asleep (general anesthetic).  Cramps that feel similar to menstrual cramps.  Bloody discharge or light to moderate bleeding.  Dark discharge. This discharge may look similar to coffee grounds. This is from the paste that was applied to the cervix to control bleeding.  Follow these instructions at home: Medicines  Take over-the-counter and prescription medicines only as told by your health care provider.  Do not take aspirin until your health care provider says it is okay. Aspirin can cause bleeding.  If you are taking pain medicine: ? You may need to prevent or treat constipation. To do this, your health care provider may recommend that you:  Drink enough fluid to keep your urine clear or pale yellow.  Take over-the-counter or prescription medicines.  Eat foods that are high in fiber, such as fresh fruits and vegetables, whole grains, bran, and beans.  Limit foods that are high in fat and processed sugars, such as fried and sweet foods. ? Do not drive or use heavy machinery. General instructions  You may resume your normal diet unless your health care provider advises you not to do so.  Take showers for the first week. Do not take baths, swim, or use hot tubs until your health care provider says it is okay.  Do not douche, use tampons, or have sex until your health care provider says it is okay.  Avoid activities that require great effort, such as exercises and heavy lifting, for at least 7-14 days.  Keep all follow-up visits as told by your health  care provider. This is important. Contact a health care provider if:  You develop a rash.  You are dizzy or lightheaded.  You feel nauseous or you vomit.  You develop a bad smelling discharge from your vagina. Get help right away if:  You have blood clots or bleeding that is heavier than a normal period. Bleeding that soaks a pad in less than 1 hour is considered heavy bleeding.  You have a fever.  You have increasing cramps.  You faint.  You have pain when you urinate.  You have severe or worsening pain.  Your pain is not relieved when you take medicine.  You have bloody urine.  You vomit. Summary  After the procedure, it is common to have cramps and dark or bloody discharge from your vagina.  Do not douche, use tampons, or have sex until your health care provider says it is okay.  Follow all other activity restrictions as told by your health care provider. This information is not intended to replace advice given to you by your health care provider. Make sure you discuss any questions you have with your health care provider. Document Released: 04/21/2005 Document Revised: 04/23/2016 Document Reviewed: 04/23/2016 Elsevier Interactive Patient Education  2017 [place]   1) The drugs that you were given will stay in your system until tomorrow so for the next 24 hours you should not:  A) Drive an automobile B) Make any legal decisions C) Drink any alcoholic beverage  2) You may resume regular meals tomorrow.  Today it is better to start with liquids and gradually work up to solid foods.  You may eat anything you prefer, but it is better to start with liquids, then soup and crackers, and gradually work up to solid foods.   3) Please notify your doctor immediately if you have any unusual bleeding, trouble breathing, redness and pain at the surgery site, drainage, fever, or pain not relieved by  medication.    4) Additional Instructions:        Please contact your physician with any problems or Same Day Surgery at [phone], Monday through Friday 6 am to 4 pm, or [place] at [facility] number at [phone].

## 2017-10-30 NOTE — Anesthesia Post-op Follow-up Note (Signed)
Anesthesia QCDR form completed.        

## 2017-10-30 NOTE — Anesthesia Preprocedure Evaluation (Signed)
Anesthesia Evaluation  Patient identified by MRN, date of birth, ID band Patient awake    Reviewed: Allergy & Precautions, NPO status , Patient's Chart, lab work & pertinent test results  History of Anesthesia Complications Negative for: history of anesthetic complications  Airway Mallampati: II       Dental  (+) Upper Dentures, Partial Lower   Pulmonary neg sleep apnea, neg COPD, Current Smoker,           Cardiovascular hypertension, Pt. on medications + Past MI (3/18)  (-) CHF (-) dysrhythmias + Valvular Problems/Murmurs (murmur, no tx)      Neuro/Psych neg Seizures    GI/Hepatic Neg liver ROS, GERD  Medicated and Controlled,  Endo/Other  neg diabetes  Renal/GU negative Renal ROS     Musculoskeletal   Abdominal   Peds  Hematology  (+) anemia ,   Anesthesia Other Findings   Reproductive/Obstetrics                             Anesthesia Physical Anesthesia Plan  ASA: III  Anesthesia Plan: General   Post-op Pain Management:    Induction: Intravenous  PONV Risk Score and Plan: 2 and Dexamethasone and Ondansetron  Airway Management Planned: LMA  Additional Equipment:   Intra-op Plan:   Post-operative Plan:   Informed Consent: I have reviewed the patients History and Physical, chart, labs and discussed the procedure including the risks, benefits and alternatives for the proposed anesthesia with the patient or authorized representative who has indicated his/her understanding and acceptance.     Plan Discussed with:   Anesthesia Plan Comments:         Anesthesia Quick Evaluation

## 2017-10-30 NOTE — Interval H&P Note (Signed)
History and Physical Interval Note:  10/30/2017 7:14 AM  [name]  has presented today for surgery, with the diagnosis of CIN 3  The various methods of treatment have been discussed with the patient and family. After consideration of risks, benefits and other options for treatment, the patient has consented to  Procedure(s): CONIZATION CERVIX WITH BIOPSY (N/A) DILATATION AND CURETTAGE /HYSTEROSCOPY (N/A) as a surgical intervention .  The patient's history has been reviewed, patient examined, no change in status, stable for surgery.  I have reviewed the patient's chart and labs.  Questions were answered to the patient's satisfaction.     [name]

## 2017-10-30 NOTE — Transfer of Care (Signed)
Immediate Anesthesia Transfer of Care Note  Patient: Dorothy Gay  Procedure(s) Performed: CONIZATION CERVIX WITH BIOPSY (N/A ) DILATATION AND CURETTAGE /HYSTEROSCOPY (N/A )  Patient Location: PACU  Anesthesia Type:General  Level of Consciousness: awake, alert , oriented and patient cooperative  Airway & Oxygen Therapy: Patient Spontanous Breathing and Patient connected to face mask oxygen  Post-op Assessment: Report given to RN, Post -op Vital signs reviewed and stable and Patient moving all extremities  Post vital signs: Reviewed and stable  Last Vitals:  Vitals Value Taken Time  BP 167/81 10/30/2017  8:41 AM  Temp 36.6 C 10/30/2017  8:41 AM  Pulse 69 10/30/2017  8:42 AM  Resp 20 10/30/2017  8:42 AM  SpO2 97 % 10/30/2017  8:42 AM  Vitals shown include unvalidated device data.  Last Pain:  Vitals:   10/30/17 3235  TempSrc: Oral  PainSc: 0-No pain         Complications: No apparent anesthesia complications

## 2017-10-30 NOTE — Anesthesia Procedure Notes (Addendum)
Procedure Name: LMA Insertion Date/Time: 10/30/2017 7:39 AM Performed by: Nile Riggs, CRNA Pre-anesthesia Checklist: Patient identified, Emergency Drugs available, Suction available, Patient being monitored and Timeout performed Patient Re-evaluated:Patient Re-evaluated prior to induction Oxygen Delivery Method: Circle system utilized Preoxygenation: Pre-oxygenation with 100% oxygen Induction Type: IV induction Ventilation: Mask ventilation without difficulty LMA: LMA inserted LMA Size: 4.0 Number of attempts: 1 Placement Confirmation: positive ETCO2,  CO2 detector and breath sounds checked- equal and bilateral Tube secured with: Tape Dental Injury: Teeth and Oropharynx as per pre-operative assessment  Comments: Atraumatic seating of LMA; soft bite block placed.

## 2017-11-03 LAB — SURGICAL PATHOLOGY

## 2017-12-07 ENCOUNTER — Telehealth: Payer: Self-pay

## 2017-12-07 NOTE — Telephone Encounter (Signed)
Notified [facility] that Dorothy Gay needs referral sent to another facility. She states she works out of town and will not be able to come to clinic on any Wednesdays. Oncology Nurse Navigator Documentation  Navigator Location: [facility] (12/07/17 1000)   )Navigator Encounter Type: Telephone (12/07/17 1000) Telephone: Outgoing Call;Patient Update (12/07/17 1000)                                                  Time Spent with Patient: 15 (12/07/17 1000)

## 2017-12-09 ENCOUNTER — Ambulatory Visit: Payer: BLUE CROSS/BLUE SHIELD

## 2018-05-15 IMAGING — CR DG CHEST 2V
2 series · 2 of 2 positions shown · non-contrast
Comparison: Chest radiograph September 07, 2016

CLINICAL DATA: Nonproductive cough for 2 days, bronchitis. History
of hypertension.

EXAM:
CHEST  2 VIEW

[chest pa]
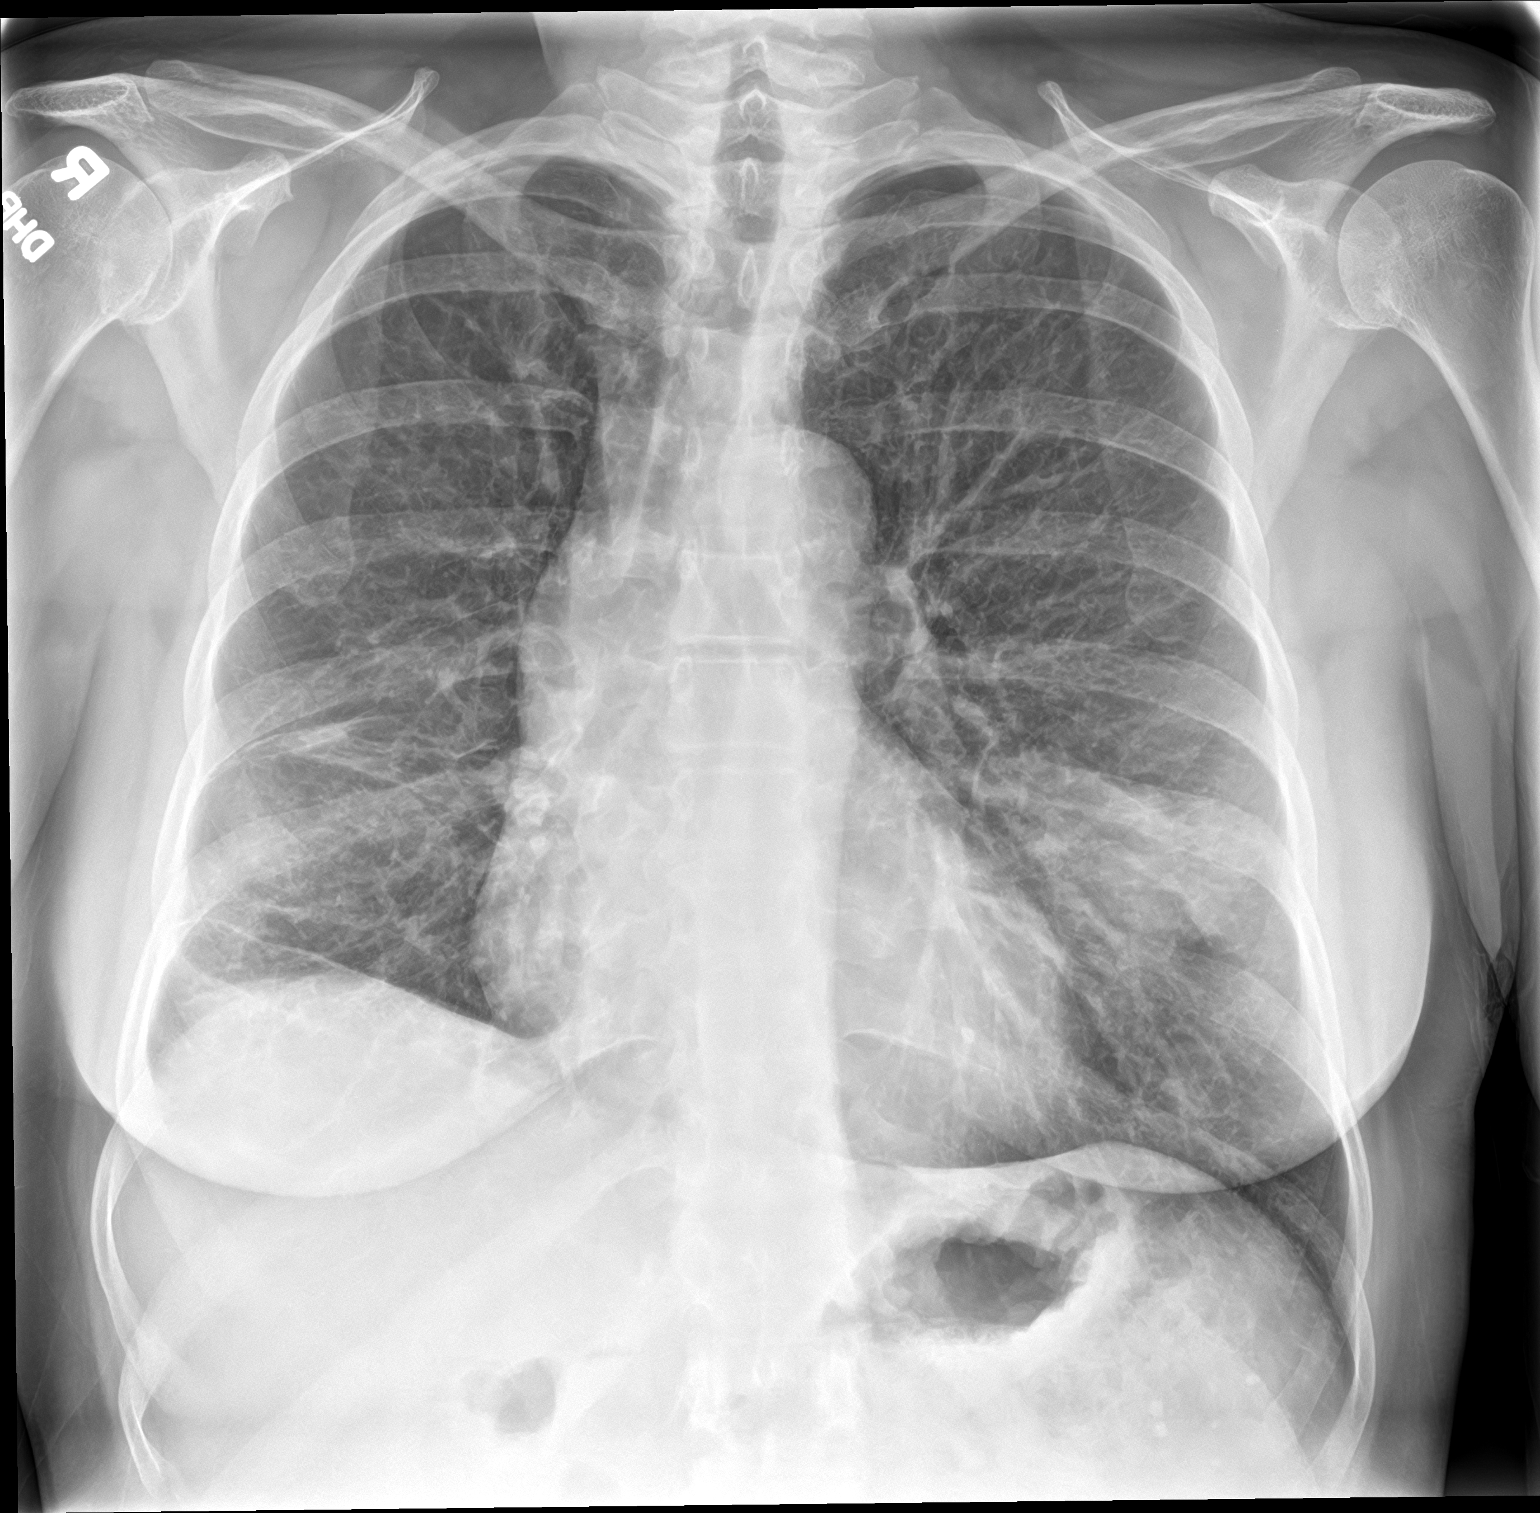

[chest lat]
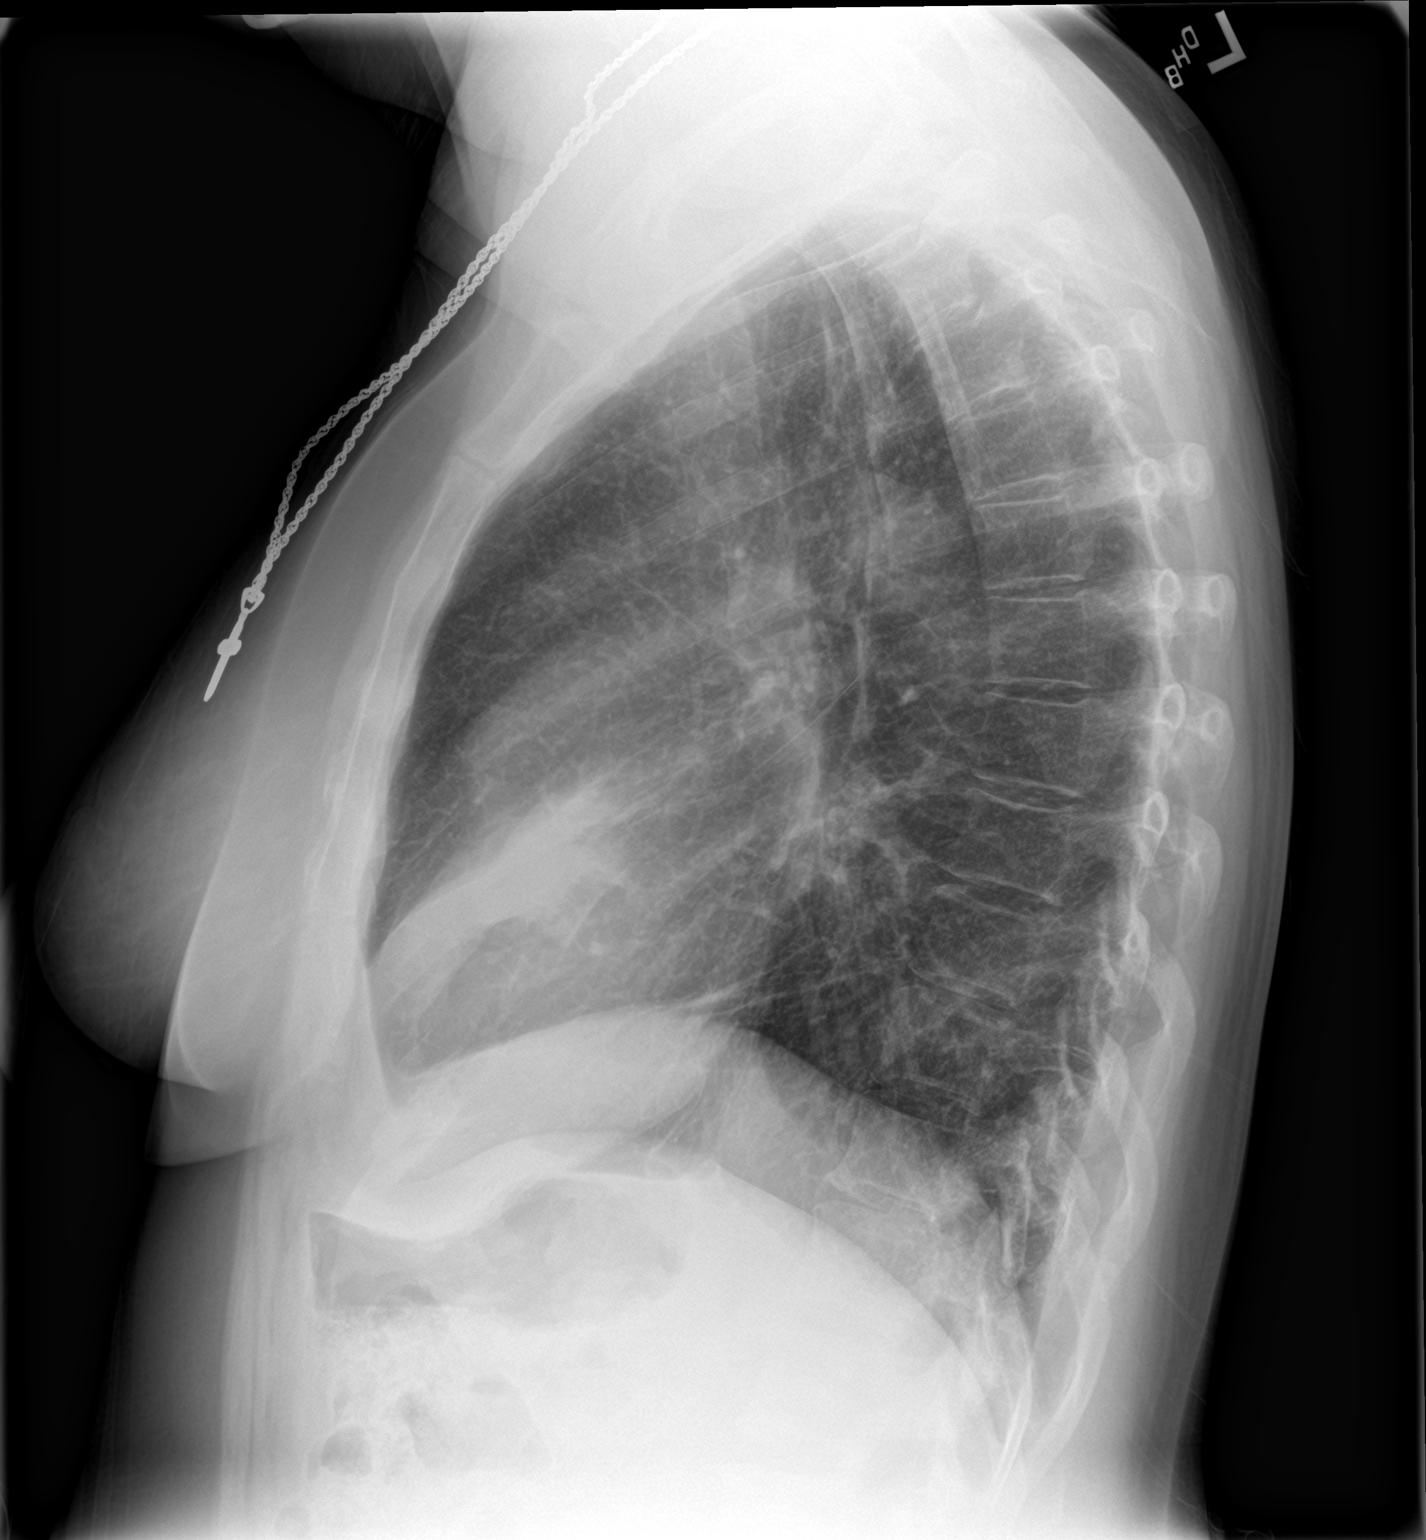

[2 of 2 positions shown; findings below may reference images not displayed]

FINDINGS: New rounded lingular airspace opacity with atelectasis. Mild chronic
bronchitic changes with RIGHT mid and lower lung zone atelectasis/
scarring. No pleural effusion. No pneumothorax. Soft tissue planes
and included osseous structures are nonsuspicious.
IMPRESSION: Lingular bronchopneumonia, less likely mass. Followup PA and lateral
chest X-ray is recommended in 3-4 weeks following trial of
antibiotic therapy to ensure resolution and exclude underlying
malignancy.

RIGHT mid and lower lung zone atelectasis/scarring.

## 2018-05-20 ENCOUNTER — Emergency Department
Admission: EM | Admit: 2018-05-20 | Discharge: 2018-05-20 | Disposition: A | Discharge status: Home / Self Care | Payer: BLUE CROSS/BLUE SHIELD | Attending: Emergency Medicine | Admitting: Emergency Medicine

## 2018-05-20 ENCOUNTER — Emergency Department: Payer: BLUE CROSS/BLUE SHIELD

## 2018-05-20 DIAGNOSIS — I252 Old myocardial infarction: Secondary | ICD-10-CM | POA: Insufficient documentation

## 2018-05-20 DIAGNOSIS — R0981 Nasal congestion: Secondary | ICD-10-CM | POA: Diagnosis not present

## 2018-05-20 DIAGNOSIS — I10 Essential (primary) hypertension: Secondary | ICD-10-CM | POA: Diagnosis not present

## 2018-05-20 DIAGNOSIS — J069 Acute upper respiratory infection, unspecified: Secondary | ICD-10-CM | POA: Diagnosis not present

## 2018-05-20 DIAGNOSIS — Z7982 Long term (current) use of aspirin: Secondary | ICD-10-CM | POA: Diagnosis not present

## 2018-05-20 DIAGNOSIS — R05 Cough: Secondary | ICD-10-CM | POA: Diagnosis not present

## 2018-05-20 DIAGNOSIS — B9789 Other viral agents as the cause of diseases classified elsewhere: Secondary | ICD-10-CM | POA: Insufficient documentation

## 2018-05-20 DIAGNOSIS — F17210 Nicotine dependence, cigarettes, uncomplicated: Secondary | ICD-10-CM | POA: Insufficient documentation

## 2018-05-20 DIAGNOSIS — Z79899 Other long term (current) drug therapy: Secondary | ICD-10-CM | POA: Insufficient documentation

## 2018-05-20 LAB — INFLUENZA PANEL BY PCR (TYPE A & B)
Influenza A By PCR: NEGATIVE
Influenza B By PCR: NEGATIVE

## 2018-05-20 MED ORDER — BENZONATATE 100 MG PO CAPS
100.0000 mg | ORAL_CAPSULE | Freq: Three times a day (TID) | ORAL | 0 refills | Status: AC | PRN
Start: 2018-05-20 — End: 2018-05-27

## 2018-05-20 NOTE — ED Triage Notes (Signed)
Patient c/o cough, and nasal congestion beginning yesterday.

## 2018-05-20 NOTE — ED Provider Notes (Signed)
[facility] Emergency Department Provider Note  ____________________________________________  Time seen: Approximately 10:45 PM  I have reviewed the triage vital signs and the nursing notes.   HISTORY  Chief Complaint Cough    HPI Dorothy Gay is a 53 y.o. female presents to the emergency department with rhinorrhea, congestion, nonproductive cough and low-grade fever for the past 2 days.  No emesis or diarrhea.  No rash.  Patient has been tolerating fluids by mouth and her own secretions.  Good urinary output today.  No recent travel.  Patient reports numerous sick contacts at her place of work.  She denies chest pain, chest tightness, shortness of breath, nausea, vomiting abdominal pain.  Patient reports that she has not taken her hydrochlorothiazide today because it makes her "pee a lot".  She reports that blood pressure in triage is "normal for me".  No other alleviating measures have been attempted.   Past Medical History:  Diagnosis Date  . Anemia   . Arthritis   . Cancer ([place])   . Chronic bronchitis ([place])   . Headache    migraines  . Heart murmur   . Hypertension   . Myocardial infarction ([place]) 09/2016   MILD PER PT-WHEN READING THRU HOSPITAL H&P IT LOOKS AS THOUGH PT DID NOT HAVE MI-TROPONINS WERE ELEVATED BUT STRESS WAS NEGATIVE    Patient Active Problem List   Diagnosis Date Noted  . Chest pain 09/07/2016    Past Surgical History:  Procedure Laterality Date  . CERVICAL CONIZATION W/BX N/A 10/30/2017   Procedure: CONIZATION CERVIX WITH BIOPSY;  Surgeon: [name], MD;  Location: [facility] ORS;  Service: Gynecology;  Laterality: N/A;  . [place]   . HYSTEROSCOPY W/D&C N/A 10/30/2017   Procedure: DILATATION AND CURETTAGE /HYSTEROSCOPY;  Surgeon: [name], MD;  Location: [facility] ORS;  Service: Gynecology;  Laterality: N/A;  . LEEP  1997    Prior to Admission medications   Medication Sig Start Date End Date Taking?  Authorizing Provider  albuterol (PROVENTIL HFA;VENTOLIN HFA) 108 (90 Base) MCG/ACT inhaler Inhale 2 puffs into the lungs every 6 (six) hours as needed for wheezing or shortness of breath. 10/21/16   Laban Emperor, PA-C  amoxicillin (AMOXIL) 500 MG capsule Take 1 capsule (500 mg total) by mouth 3 (three) times daily. Patient not taking: Reported on 10/23/2017 08/08/17   Sable Feil, PA-C  aspirin EC 81 MG EC tablet Take 1 tablet (81 mg total) by mouth daily. Patient not taking: Reported on 10/30/2017 09/09/16   Fritzi Mandes, MD  azithromycin (ZITHROMAX Z-PAK) 250 MG tablet Take 2 tablets (500 mg) on  Day 1,  followed by 1 tablet (250 mg) once daily on Days 2 through 5. Patient not taking: Reported on 10/23/2017 10/21/16   Laban Emperor, PA-C  benzonatate (TESSALON PERLES) 100 MG capsule Take 1 capsule (100 mg total) by mouth 3 (three) times daily as needed for up to 7 days for cough. 05/20/18 05/27/18  Lannie Fields, PA-C  docusate sodium (COLACE) 100 MG capsule Take 1 capsule (100 mg total) by mouth 2 (two) times daily. To keep stools soft 10/30/17   [name], MD  felodipine (PLENDIL) 5 MG 24 hr tablet Take 5 mg by mouth every evening.    [provider]  fexofenadine-pseudoephedrine (ALLEGRA-D) 60-120 MG 12 hr tablet Take 1 tablet by mouth 2 (two) times daily. Patient not taking: Reported on 10/23/2017 08/08/17   Sable Feil, PA-C  hydrochlorothiazide (HYDRODIURIL) 25 MG  tablet Take 1 tablet (25 mg total) by mouth daily. 09/09/16   Fritzi Mandes, MD  ibuprofen (ADVIL,MOTRIN) 800 MG tablet Take 1 tablet (800 mg total) by mouth every 8 (eight) hours as needed for moderate pain. 10/30/17   [name], MD  levothyroxine (SYNTHROID, LEVOTHROID) 25 MCG tablet Take 25 mcg by mouth daily before breakfast.    [provider]  meloxicam (MOBIC) 15 MG tablet Take 1 tablet (15 mg total) by mouth daily. Patient not taking: Reported on 10/23/2017 04/11/17   Cuthriell, Charline Bills, PA-C   methocarbamol (ROBAXIN) 500 MG tablet Take 1 tablet (500 mg total) by mouth 4 (four) times daily. Patient not taking: Reported on 10/23/2017 04/11/17   Cuthriell, Charline Bills, PA-C  oxycodone (OXY-IR) 5 MG capsule Take 1 capsule (5 mg total) by mouth every 6 (six) hours as needed for pain. 10/30/17   [name], MD  pantoprazole (PROTONIX) 40 MG tablet Take 40 mg by mouth daily.    [provider]  predniSONE (DELTASONE) 10 MG tablet Take 6 tablets on day 1, take 5 tablets on day 2, take 4 tablets on day 3, take 3 tablets on day 4, take 2 tablets on day 5, take 1 tablet on day 6 Patient not taking: Reported on 10/23/2017 10/21/16   Laban Emperor, PA-C  ranitidine (ZANTAC) 150 MG tablet Take 150 mg by mouth every morning.    [provider]    Allergies Amlodipine; Lisinopril; and Shellfish allergy  Family History  Problem Relation Age of Onset  . Hypertension Mother   . Atrial fibrillation Mother   . Heart disease Maternal Grandmother     Social History Social History   Tobacco Use  . Smoking status: Current Some Day Smoker    Packs/day: 0.50    Years: 35.00    Pack years: 17.50    Types: Cigarettes  . Smokeless tobacco: Never Used  Substance Use Topics  . Alcohol use: No  . Drug use: No      Review of Systems  Constitutional: Patient has fever.  Eyes: No visual changes. No discharge ENT: Patient has congestion.  Cardiovascular: no chest pain. Respiratory: Patient has cough.  Gastrointestinal: No abdominal pain.  No nausea, no vomiting. No diarrhea.  Genitourinary: Negative for dysuria. No hematuria Musculoskeletal: Patient has myalgias.  Skin: Negative for rash, abrasions, lacerations, ecchymosis. Neurological: No headache, no focal weakness or numbness.     ____________________________________________   PHYSICAL EXAM:  VITAL SIGNS: ED Triage Vitals [05/20/18 2020]  Enc Vitals Group     BP (!) 195/93     Pulse Rate 60     Resp 18      Temp 98.3 F (36.8 C)     Temp Source Oral     SpO2 98 %     Weight 175 lb (79.4 kg)     Height 5\' 7"  (1.702 m)     Head Circumference      Peak Flow      Pain Score 6     Pain Loc      Pain Edu?      Excl. in [place]?     Constitutional: Alert and oriented. Patient is lying supine. Eyes: Conjunctivae are normal. PERRL. EOMI. Head: Atraumatic. ENT:      Ears: Tympanic membranes are mildly injected with mild effusion bilaterally.       Nose: No congestion/rhinnorhea.      Mouth/Throat: Mucous membranes are moist. Posterior pharynx is mildly erythematous.  Hematological/Lymphatic/Immunilogical:  No cervical lymphadenopathy.  Cardiovascular: Normal rate, regular rhythm. Normal S1 and S2.  Good peripheral circulation. Respiratory: Normal respiratory effort without tachypnea or retractions. Lungs CTAB. Good air entry to the bases with no decreased or absent breath sounds. Gastrointestinal: Bowel sounds 4 quadrants. Soft and nontender to palpation. No guarding or rigidity. No palpable masses. No distention. No CVA tenderness. Musculoskeletal: Full range of motion to all extremities. No gross deformities appreciated. Neurologic:  Normal speech and language. No gross focal neurologic deficits are appreciated.  Skin:  Skin is warm, dry and intact. No rash noted. Psychiatric: Mood and affect are normal. Speech and behavior are normal. Patient exhibits appropriate insight and judgement.    ____________________________________________   LABS (all labs ordered are listed, but only abnormal results are displayed)  Labs Reviewed  INFLUENZA PANEL BY PCR (TYPE A & B)   ____________________________________________  EKG   ____________________________________________  RADIOLOGY I personally viewed and evaluated these images as part of my medical decision making, as well as reviewing the written report by the radiologist.  Dg Chest 2 View  Result Date: 05/20/2018 CLINICAL DATA:  Acute onset  of cough and sinonasal congestion that began yesterday. Current smoker. EXAM: CHEST - 2 VIEW COMPARISON:  10/21/2016 and earlier. FINDINGS: Cardiomediastinal silhouette unremarkable and unchanged. Pleuroparenchymal scarring on the RIGHT with involvement of the RIGHT base and the SUPERIOR segment RIGHT LOWER LOBE, unchanged. Lungs otherwise clear. No localized airspace consolidation. No pleural effusions. No pneumothorax. Normal pulmonary vascularity. Visualized bony thorax intact. IMPRESSION: No acute cardiopulmonary disease. Electronically Signed   By: Evangeline Dakin M.D.   On: 05/20/2018 20:55    ____________________________________________    PROCEDURES  Procedure(s) performed:    Procedures    Medications - No data to display   ____________________________________________   INITIAL IMPRESSION / ASSESSMENT AND PLAN / ED COURSE  Pertinent labs & imaging results that were available during my care of the patient were reviewed by me and considered in my medical decision making (see chart for details).  Review of the [place] CSRS was performed in accordance of the [place] prior to dispensing any controlled drugs.    Assessment and Plan: Viral URI with cough Patient presents to the emergency department with nasal congestion nonproductive cough for the past 2 days.  Patient tested negative for influenza a and B in the emergency department tonight.  X-ray examination revealed no consolidations, opacities or infiltrates that would suggest community-acquired pneumonia.  Patient was discharged with [facility].  Rest and hydration were encouraged.  I advised following up with primary care regarding hypertension noted at triage.  All patient questions were answered.     ____________________________________________  FINAL CLINICAL IMPRESSION(S) / ED DIAGNOSES  Final diagnoses:  Viral URI with cough      NEW MEDICATIONS STARTED DURING THIS VISIT:  ED Discharge Orders          Ordered    benzonatate (TESSALON PERLES) 100 MG capsule  3 times daily PRN     05/20/18 2241              This chart was dictated using voice recognition software/Dragon. Despite best efforts to proofread, errors can occur which can change the meaning. Any change was purely unintentional.    Lannie Fields, PA-C 05/20/18 2248    Carrie Mew, MD 05/27/18 [phone]

## 2018-06-23 ENCOUNTER — Other Ambulatory Visit: Payer: Self-pay

## 2018-06-23 ENCOUNTER — Encounter: Payer: Self-pay | Admitting: *Deleted

## 2018-06-23 ENCOUNTER — Emergency Department
Admission: EM | Admit: 2018-06-23 | Discharge: 2018-06-23 | Disposition: A | Discharge status: Home / Self Care | Payer: No Typology Code available for payment source | Attending: Emergency Medicine | Admitting: Emergency Medicine

## 2018-06-23 DIAGNOSIS — Z7982 Long term (current) use of aspirin: Secondary | ICD-10-CM | POA: Insufficient documentation

## 2018-06-23 DIAGNOSIS — L03011 Cellulitis of right finger: Secondary | ICD-10-CM | POA: Diagnosis not present

## 2018-06-23 DIAGNOSIS — F17210 Nicotine dependence, cigarettes, uncomplicated: Secondary | ICD-10-CM | POA: Insufficient documentation

## 2018-06-23 DIAGNOSIS — R2231 Localized swelling, mass and lump, right upper limb: Secondary | ICD-10-CM | POA: Diagnosis present

## 2018-06-23 DIAGNOSIS — I252 Old myocardial infarction: Secondary | ICD-10-CM | POA: Diagnosis not present

## 2018-06-23 DIAGNOSIS — Z79899 Other long term (current) drug therapy: Secondary | ICD-10-CM | POA: Insufficient documentation

## 2018-06-23 DIAGNOSIS — I10 Essential (primary) hypertension: Secondary | ICD-10-CM | POA: Diagnosis not present

## 2018-06-23 MED ORDER — SULFAMETHOXAZOLE-TRIMETHOPRIM 800-160 MG PO TABS
1.0000 | ORAL_TABLET | Freq: Once | ORAL | Status: AC
  Administered 2018-06-23: 1 via ORAL
  Filled 2018-06-23: qty 1

## 2018-06-23 MED ORDER — SULFAMETHOXAZOLE-TRIMETHOPRIM 800-160 MG PO TABS: 1.0000 | ORAL_TABLET | Freq: Two times a day (BID) | ORAL | 0 refills | Status: DC

## 2018-06-23 NOTE — ED Triage Notes (Signed)
Pt reports pain in right index finger.  Pt noticed a bite to index finger yesterday.   Today, increased pain.  No swelling noted.

## 2018-06-23 NOTE — Discharge Instructions (Addendum)
Please soak finger in warm water and hydrogen peroxide daily.  Take antibiotics as prescribed.  If any increasing pain swelling warmth or redness please return to the emergency department.

## 2018-06-23 NOTE — ED Provider Notes (Signed)
[facility] EMERGENCY DEPARTMENT Provider Note   CSN: [id number] Arrival date & time: 06/23/18  2028    History   Chief Complaint Chief Complaint  Patient presents with  . Insect Bite    HPI Dorothy Gay is a 53 y.o. female presents to the emergency department for evaluation of swelling to the tip of the right index finger that began yesterday morning.  Patient states she feels that she was bit by spider.  She denies seeing or feeling a spider bite.  Patient states her entire finger was red and swollen yesterday, redness and swelling have improved some today but she has a area of swelling and tenderness along the tip of the right index finger.  She denies any drainage.  No trauma or injury.     HPI  Past Medical History:  Diagnosis Date  . Anemia   . Arthritis   . Cancer ([place])   . Chronic bronchitis ([place])   . Headache    migraines  . Heart murmur   . Hypertension   . Myocardial infarction ([place]) 09/2016   MILD PER PT-WHEN READING THRU HOSPITAL H&P IT LOOKS AS THOUGH PT DID NOT HAVE MI-TROPONINS WERE ELEVATED BUT STRESS WAS NEGATIVE    Patient Active Problem List   Diagnosis Date Noted  . Chest pain 09/07/2016    Past Surgical History:  Procedure Laterality Date  . CERVICAL CONIZATION W/BX N/A 10/30/2017   Procedure: CONIZATION CERVIX WITH BIOPSY;  Surgeon: [name], MD;  Location: [facility] ORS;  Service: Gynecology;  Laterality: N/A;  . [place]   . HYSTEROSCOPY W/D&C N/A 10/30/2017   Procedure: DILATATION AND CURETTAGE /HYSTEROSCOPY;  Surgeon: [name], MD;  Location: [facility] ORS;  Service: Gynecology;  Laterality: N/A;  . LEEP  1997     OB History   No obstetric history on file.      Home Medications    Prior to Admission medications   Medication Sig Start Date End Date Taking? Authorizing Provider  albuterol (PROVENTIL HFA;VENTOLIN HFA) 108 (90 Base) MCG/ACT inhaler Inhale 2 puffs into the lungs every 6 (six)  hours as needed for wheezing or shortness of breath. 10/21/16   Laban Emperor, PA-C  amoxicillin (AMOXIL) 500 MG capsule Take 1 capsule (500 mg total) by mouth 3 (three) times daily. Patient not taking: Reported on 10/23/2017 08/08/17   Sable Feil, PA-C  aspirin EC 81 MG EC tablet Take 1 tablet (81 mg total) by mouth daily. Patient not taking: Reported on 10/30/2017 09/09/16   Fritzi Mandes, MD  azithromycin (ZITHROMAX Z-PAK) 250 MG tablet Take 2 tablets (500 mg) on  Day 1,  followed by 1 tablet (250 mg) once daily on Days 2 through 5. Patient not taking: Reported on 10/23/2017 10/21/16   Laban Emperor, PA-C  docusate sodium (COLACE) 100 MG capsule Take 1 capsule (100 mg total) by mouth 2 (two) times daily. To keep stools soft 10/30/17   [name], MD  felodipine (PLENDIL) 5 MG 24 hr tablet Take 5 mg by mouth every evening.    [provider]  fexofenadine-pseudoephedrine (ALLEGRA-D) 60-120 MG 12 hr tablet Take 1 tablet by mouth 2 (two) times daily. Patient not taking: Reported on 10/23/2017 08/08/17   Sable Feil, PA-C  hydrochlorothiazide (HYDRODIURIL) 25 MG tablet Take 1 tablet (25 mg total) by mouth daily. 09/09/16   Fritzi Mandes, MD  ibuprofen (ADVIL,MOTRIN) 800 MG tablet Take 1 tablet (800 mg total) by mouth every 8 (eight)  hours as needed for moderate pain. 10/30/17   [name], MD  levothyroxine (SYNTHROID, LEVOTHROID) 25 MCG tablet Take 25 mcg by mouth daily before breakfast.    [provider]  meloxicam (MOBIC) 15 MG tablet Take 1 tablet (15 mg total) by mouth daily. Patient not taking: Reported on 10/23/2017 04/11/17   Cuthriell, Charline Bills, PA-C  methocarbamol (ROBAXIN) 500 MG tablet Take 1 tablet (500 mg total) by mouth 4 (four) times daily. Patient not taking: Reported on 10/23/2017 04/11/17   Cuthriell, Charline Bills, PA-C  oxycodone (OXY-IR) 5 MG capsule Take 1 capsule (5 mg total) by mouth every 6 (six) hours as needed for pain. 10/30/17   [name], MD    pantoprazole (PROTONIX) 40 MG tablet Take 40 mg by mouth daily.    [provider]  predniSONE (DELTASONE) 10 MG tablet Take 6 tablets on day 1, take 5 tablets on day 2, take 4 tablets on day 3, take 3 tablets on day 4, take 2 tablets on day 5, take 1 tablet on day 6 Patient not taking: Reported on 10/23/2017 10/21/16   Laban Emperor, PA-C  ranitidine (ZANTAC) 150 MG tablet Take 150 mg by mouth every morning.    [provider]  sulfamethoxazole-trimethoprim (BACTRIM DS,SEPTRA DS) 800-160 MG tablet Take 1 tablet by mouth 2 (two) times daily. 06/23/18   Duanne Guess, PA-C    Family History Family History  Problem Relation Age of Onset  . Hypertension Mother   . Atrial fibrillation Mother   . Heart disease Maternal Grandmother     Social History Social History   Tobacco Use  . Smoking status: Current Some Day Smoker    Packs/day: 0.50    Years: 35.00    Pack years: 17.50    Types: Cigarettes  . Smokeless tobacco: Never Used  Substance Use Topics  . Alcohol use: No  . Drug use: No     Allergies   Amlodipine; Lisinopril; and Shellfish allergy   Review of Systems Review of Systems  Constitutional: Negative for chills and fever.  Musculoskeletal: Positive for myalgias. Negative for joint swelling.  Skin: Positive for wound. Negative for color change and rash.  Neurological: Negative for numbness.     Physical Exam Updated Vital Signs BP (!) 202/91 (BP Location: Left Arm)   Pulse (!) 55   Temp 98.5 F (36.9 C) (Oral)   Resp 18   Ht 5\' 7"  (1.702 m)   Wt 77.1 kg   SpO2 99%   BMI 26.63 kg/m   Physical Exam Constitutional:      Appearance: She is well-developed.  HENT:     Head: Normocephalic and atraumatic.  Eyes:     Conjunctiva/sclera: Conjunctivae normal.  Neck:     Musculoskeletal: Normal range of motion.  Cardiovascular:     Rate and Rhythm: Normal rate.  Pulmonary:     Effort: Pulmonary effort is normal. No respiratory distress.   Musculoskeletal: Normal range of motion.     Comments: Examination of right index finger shows full composite fist.  There is no swelling throughout the digit and she has localized ecchymosis along the tip of the right index finger along the volar aspect with mild tenderness palpation.  Area of ecchymosis measures 0.5 cm in diameter with mild surrounding erythema.  Along the central portion of the area of ecchymosis there are 2 small vesicles that appear to be in the early stages.  There is no swelling throughout the pulp space nor any  redness or swelling along the nail or nail folds.  Skin:    General: Skin is warm.     Findings: No rash.  Neurological:     Mental Status: She is alert and oriented to person, place, and time.  Psychiatric:        Behavior: Behavior normal.        Thought Content: Thought content normal.      ED Treatments / Results  Labs (all labs ordered are listed, but only abnormal results are displayed) Labs Reviewed - No data to display  EKG None  Radiology No results found.  Procedures Procedures (including critical care time)  Medications Ordered in ED Medications  sulfamethoxazole-trimethoprim (BACTRIM DS,SEPTRA DS) 800-160 MG per tablet 1 tablet (has no administration in time range)     Initial Impression / Assessment and Plan / ED Course  I have reviewed the triage vital signs and the nursing notes.  Pertinent labs & imaging results that were available during my care of the patient were reviewed by me and considered in my medical decision making (see chart for details).        53 year old female with swelling and redness to the tip of the right index finger.  This could be a possible atypical beginning of a early herpetic whitlow.  There is some concern for cellulitis as there is some surrounding erythema around the area.  We will go ahead and start Bactrim.  Patient also digit and peroxide and warm water.  She understands signs symptoms return  to ED for.  Final Clinical Impressions(s) / ED Diagnoses   Final diagnoses:  Cellulitis of finger of right hand    ED Discharge Orders         Ordered    sulfamethoxazole-trimethoprim (BACTRIM DS,SEPTRA DS) 800-160 MG tablet  2 times daily     06/23/18 2155           Renata Caprice 06/23/18 2159    Delman Kitten, MD 06/23/18 [phone]

## 2018-06-23 NOTE — ED Notes (Addendum)
Pt has little area noted to right pointer finger. No drainage noted. Pt states pain and unsure if something bit her

## 2018-07-03 ENCOUNTER — Emergency Department
Admission: EM | Admit: 2018-07-03 | Discharge: 2018-07-03 | Disposition: A | Discharge status: Home / Self Care | Payer: PRIVATE HEALTH INSURANCE | Attending: Emergency Medicine | Admitting: Emergency Medicine

## 2018-07-03 ENCOUNTER — Other Ambulatory Visit: Payer: Self-pay

## 2018-07-03 ENCOUNTER — Emergency Department: Payer: PRIVATE HEALTH INSURANCE

## 2018-07-03 ENCOUNTER — Encounter: Payer: Self-pay | Admitting: Emergency Medicine

## 2018-07-03 DIAGNOSIS — F17210 Nicotine dependence, cigarettes, uncomplicated: Secondary | ICD-10-CM | POA: Insufficient documentation

## 2018-07-03 DIAGNOSIS — I10 Essential (primary) hypertension: Secondary | ICD-10-CM | POA: Diagnosis not present

## 2018-07-03 DIAGNOSIS — Z859 Personal history of malignant neoplasm, unspecified: Secondary | ICD-10-CM | POA: Insufficient documentation

## 2018-07-03 DIAGNOSIS — Z79899 Other long term (current) drug therapy: Secondary | ICD-10-CM | POA: Insufficient documentation

## 2018-07-03 DIAGNOSIS — I252 Old myocardial infarction: Secondary | ICD-10-CM | POA: Insufficient documentation

## 2018-07-03 DIAGNOSIS — R05 Cough: Secondary | ICD-10-CM | POA: Diagnosis present

## 2018-07-03 DIAGNOSIS — R1031 Right lower quadrant pain: Secondary | ICD-10-CM | POA: Diagnosis not present

## 2018-07-03 DIAGNOSIS — J101 Influenza due to other identified influenza virus with other respiratory manifestations: Secondary | ICD-10-CM | POA: Diagnosis not present

## 2018-07-03 LAB — CBC WITH DIFFERENTIAL/PLATELET
ABS IMMATURE GRANULOCYTES: 0 10*3/uL (ref 0.00–0.07)
BASOS PCT: 0 %
Basophils Absolute: 0 10*3/uL (ref 0.0–0.1)
Eosinophils Absolute: 0 10*3/uL (ref 0.0–0.5)
Eosinophils Relative: 0 %
HEMATOCRIT: 41.3 % (ref 36.0–46.0)
HEMOGLOBIN: 13.3 g/dL (ref 12.0–15.0)
Immature Granulocytes: 0 %
LYMPHS PCT: 33 %
Lymphs Abs: 0.8 10*3/uL (ref 0.7–4.0)
MCH: 28.6 pg (ref 26.0–34.0)
MCHC: 32.2 g/dL (ref 30.0–36.0)
MCV: 88.8 fL (ref 80.0–100.0)
MONO ABS: 0.6 10*3/uL (ref 0.1–1.0)
MONOS PCT: 23 %
NEUTROS ABS: 1.1 10*3/uL — AB (ref 1.7–7.7)
Neutrophils Relative %: 44 %
Platelets: 158 10*3/uL (ref 150–400)
RBC: 4.65 MIL/uL (ref 3.87–5.11)
RDW: 15.2 % (ref 11.5–15.5)
WBC: 2.6 10*3/uL — ABNORMAL LOW (ref 4.0–10.5)
nRBC: 0 % (ref 0.0–0.2)

## 2018-07-03 LAB — COMPREHENSIVE METABOLIC PANEL
ALT: 36 U/L (ref 0–44)
AST: 46 U/L — ABNORMAL HIGH (ref 15–41)
Albumin: 4.2 g/dL (ref 3.5–5.0)
Alkaline Phosphatase: 116 U/L (ref 38–126)
Anion gap: 11 (ref 5–15)
BUN: 8 mg/dL (ref 6–20)
CHLORIDE: 104 mmol/L (ref 98–111)
CO2: 22 mmol/L (ref 22–32)
CREATININE: 0.83 mg/dL (ref 0.44–1.00)
Calcium: 8.7 mg/dL — ABNORMAL LOW (ref 8.9–10.3)
GFR calc Af Amer: >60 mL/min (ref >60)
Glucose, Bld: 88 mg/dL (ref 70–99)
Potassium: 3.7 mmol/L (ref 3.5–5.1)
Sodium: 137 mmol/L (ref 135–145)
Total Bilirubin: 0.2 mg/dL — ABNORMAL LOW (ref 0.3–1.2)
Total Protein: 7.6 g/dL (ref 6.5–8.1)

## 2018-07-03 LAB — LIPASE, BLOOD: LIPASE: 28 U/L (ref 11–51)

## 2018-07-03 LAB — URINALYSIS, COMPLETE (UACMP) WITH MICROSCOPIC
Bacteria, UA: NONE SEEN
Bilirubin Urine: NEGATIVE
Glucose, UA: NEGATIVE mg/dL
Ketones, ur: 5 mg/dL — AB
Leukocytes,Ua: NEGATIVE
Nitrite: NEGATIVE
Protein, ur: NEGATIVE mg/dL
Specific Gravity, Urine: 1.006 (ref 1.005–1.030)
pH: 6 (ref 5.0–8.0)

## 2018-07-03 LAB — INFLUENZA PANEL BY PCR (TYPE A & B)
Influenza A By PCR: POSITIVE — AB
Influenza B By PCR: NEGATIVE

## 2018-07-03 MED ORDER — ONDANSETRON HCL 4 MG/2ML IJ SOLN
4.0000 mg | Freq: Once | INTRAMUSCULAR | Status: AC
  Administered 2018-07-03: 4 mg via INTRAVENOUS
  Filled 2018-07-03: qty 2

## 2018-07-03 MED ORDER — SODIUM CHLORIDE 0.9 % IV BOLUS
1000.0000 mL | Freq: Once | INTRAVENOUS | Status: AC
  Administered 2018-07-03: 1000 mL via INTRAVENOUS

## 2018-07-03 MED ORDER — IOHEXOL 300 MG/ML  SOLN
100.0000 mL | Freq: Once | INTRAMUSCULAR | Status: AC | PRN
  Administered 2018-07-03: 100 mL via INTRAVENOUS
  Filled 2018-07-03: qty 100

## 2018-07-03 MED ORDER — BENZONATATE 200 MG PO CAPS: 200.0000 mg | ORAL_CAPSULE | Freq: Three times a day (TID) | ORAL | 0 refills | Status: DC | PRN

## 2018-07-03 NOTE — ED Provider Notes (Signed)
[facility] Emergency Department Provider Note  ____________________________________________   First MD Initiated Contact with Patient 07/03/18 1354     (approximate)  I have reviewed the triage vital signs and the nursing notes.   HISTORY  Chief Complaint Cough; Nasal Congestion; and Nausea    HPI LIDIA CLAVIJO is a 53 y.o. female presents emergency department complaint of cough, congestion, body aches, weakness, and abdominal pain that started 2 days ago.  Some nausea but no vomiting.  Decreased appetite.  No diarrhea.  She denies any chest pain or shortness of breath.  She denies any swelling in extremities.   She does state that she had some burning with urination 2 days ago and foster system with [employer].  She states she feels better after that.   Past Medical History:  Diagnosis Date  . Anemia   . Arthritis   . Cancer ([place])   . Chronic bronchitis ([place])   . Headache    migraines  . Heart murmur   . Hypertension   . Myocardial infarction ([place]) 09/2016   MILD PER PT-WHEN READING THRU HOSPITAL H&P IT LOOKS AS THOUGH PT DID NOT HAVE MI-TROPONINS WERE ELEVATED BUT STRESS WAS NEGATIVE    Patient Active Problem List   Diagnosis Date Noted  . Chest pain 09/07/2016    Past Surgical History:  Procedure Laterality Date  . CERVICAL CONIZATION W/BX N/A 10/30/2017   Procedure: CONIZATION CERVIX WITH BIOPSY;  Surgeon: [name], MD;  Location: [facility] ORS;  Service: Gynecology;  Laterality: N/A;  . [place]   . HYSTEROSCOPY W/D&C N/A 10/30/2017   Procedure: DILATATION AND CURETTAGE /HYSTEROSCOPY;  Surgeon: [name], MD;  Location: [facility] ORS;  Service: Gynecology;  Laterality: N/A;  . LEEP  1997    Prior to Admission medications   Medication Sig Start Date End Date Taking? Authorizing Provider  albuterol (PROVENTIL HFA;VENTOLIN HFA) 108 (90 Base) MCG/ACT inhaler Inhale 2 puffs into the lungs every 6 (six) hours as needed for  wheezing or shortness of breath. 10/21/16   Laban Emperor, PA-C  benzonatate (TESSALON) 200 MG capsule Take 1 capsule (200 mg total) by mouth 3 (three) times daily as needed for cough. 07/03/18   Jovana Rembold, Linden Dolin, PA-C  docusate sodium (COLACE) 100 MG capsule Take 1 capsule (100 mg total) by mouth 2 (two) times daily. To keep stools soft 10/30/17   [name], MD  felodipine (PLENDIL) 5 MG 24 hr tablet Take 5 mg by mouth every evening.    [provider]  hydrochlorothiazide (HYDRODIURIL) 25 MG tablet Take 1 tablet (25 mg total) by mouth daily. 09/09/16   Fritzi Mandes, MD  ibuprofen (ADVIL,MOTRIN) 800 MG tablet Take 1 tablet (800 mg total) by mouth every 8 (eight) hours as needed for moderate pain. 10/30/17   [name], MD  levothyroxine (SYNTHROID, LEVOTHROID) 25 MCG tablet Take 25 mcg by mouth daily before breakfast.    [provider]  oxycodone (OXY-IR) 5 MG capsule Take 1 capsule (5 mg total) by mouth every 6 (six) hours as needed for pain. 10/30/17   [name], MD  pantoprazole (PROTONIX) 40 MG tablet Take 40 mg by mouth daily.    [provider]  ranitidine (ZANTAC) 150 MG tablet Take 150 mg by mouth every morning.    [provider]  sulfamethoxazole-trimethoprim (BACTRIM DS,SEPTRA DS) 800-160 MG tablet Take 1 tablet by mouth 2 (two) times daily. 06/23/18   Duanne Guess, PA-C  Allergies Amlodipine; Lisinopril; and Shellfish allergy  Family History  Problem Relation Age of Onset  . Hypertension Mother   . Atrial fibrillation Mother   . Heart disease Maternal Grandmother     Social History Social History   Tobacco Use  . Smoking status: Current Some Day Smoker    Packs/day: 0.50    Years: 35.00    Pack years: 17.50    Types: Cigarettes  . Smokeless tobacco: Never Used  Substance Use Topics  . Alcohol use: No  . Drug use: No    Review of Systems  Constitutional: No fever/chills Eyes: No visual changes. ENT: No sore  throat. Respiratory: Positive cough Gastrointestinal: Positive for abdominal pain and dysuria Genitourinary: Positive for dysuria. Musculoskeletal: Positive for back pain. Skin: Negative for rash.    ____________________________________________   PHYSICAL EXAM:  VITAL SIGNS: ED Triage Vitals [07/03/18 1333]  Enc Vitals Group     BP (!) 177/89     Pulse Rate (!) 56     Resp 18     Temp 98.1 F (36.7 C)     Temp Source Oral     SpO2 98 %     Weight 170 lb (77.1 kg)     Height 5\' 7"  (1.702 m)     Head Circumference      Peak Flow      Pain Score 4     Pain Loc      Pain Edu?      Excl. in [place]?     Constitutional: Alert and oriented. Well appearing and in no acute distress. Eyes: Conjunctivae are normal.  Head: Atraumatic. Nose: No congestion/rhinnorhea. Mouth/Throat: Mucous membranes are moist.   Neck:  supple no lymphadenopathy noted Cardiovascular: Normal rate, regular rhythm. Heart sounds are normal Respiratory: Normal respiratory effort.  No retractions, lungs c t a  Abd: soft tender in right lower quadrant, Bs normal all 4 quad GU: deferred Musculoskeletal: FROM all extremities, warm and well perfused Neurologic:  Normal speech and language.  Skin:  Skin is warm, dry and intact. No rash noted. Psychiatric: Mood and affect are normal. Speech and behavior are normal.  ____________________________________________   LABS (all labs ordered are listed, but only abnormal results are displayed)  Labs Reviewed  COMPREHENSIVE METABOLIC PANEL - Abnormal; Notable for the following components:      Result Value   Calcium 8.7 (*)    AST 46 (*)    Total Bilirubin 0.2 (*)    All other components within normal limits  CBC WITH DIFFERENTIAL/PLATELET - Abnormal; Notable for the following components:   WBC 2.6 (*)    Neutro Abs 1.1 (*)    All other components within normal limits  URINALYSIS, COMPLETE (UACMP) WITH MICROSCOPIC - Abnormal; Notable for the following  components:   Color, Urine STRAW (*)    APPearance CLEAR (*)    Hgb urine dipstick MODERATE (*)    Ketones, ur 5 (*)    All other components within normal limits  INFLUENZA PANEL BY PCR (TYPE A & B) - Abnormal; Notable for the following components:   Influenza A By PCR POSITIVE (*)    All other components within normal limits  LIPASE, BLOOD   ____________________________________________   ____________________________________________  RADIOLOGY  CT abdomen/pelvis with IV contrast is negative for acute appendicitis  ____________________________________________   PROCEDURES  Procedure(s) performed: Saline lock, normal saline 1 L IV, Zofran 4 mg IV  Procedures    ____________________________________________   INITIAL IMPRESSION /  ASSESSMENT AND PLAN / ED COURSE  Pertinent labs & imaging results that were available during my care of the patient were reviewed by me and considered in my medical decision making (see chart for details).   Patient is 53 year old female presents emergency department complaining of flulike symptoms along with right lower quadrant pain.  Physical exam shows patient to have a wet cough, lungs are clear to auscultation, abdomen is soft and tender in the right lower quadrant.  Flu swab is positive for influenza A, urinalysis is normal, lipase is negative, comprehensive metabolic panel is basically normal with a mild decrease in her calcium, mild increase in the AST, CBC shows a decreased WBC at 2.6  Explained all of the lab findings to the patient. On reexamination of the abdomen she is still tender in the right lower quadrant.  Therefore CT abdomen/pelvis with IV contrast was ordered    ----------------------------------------- 5:46 PM on 07/03/2018 -----------------------------------------  CT abdomen/pelvis is negative for acute appendicitis.  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  She states she understands will comply.  Due to the number of days she has had her symptoms she was not placed on Tamiflu.  She is to take Tylenol/ibuprofen for fever as needed.  Given a prescription for Tessalon Perles for cough.  Encourage fluids.  She states she understands and will comply with our instructions.  She discharged stable condition.  As part of my medical decision making, I reviewed the following data within the [place] History obtained from family, Nursing notes reviewed and incorporated, Labs reviewed flu test is positive, see review of other labs above old chart reviewed, Radiograph reviewed CT abdomen/pelvis negative for appendicitis, Notes from prior ED visits and [place] Controlled Substance Database  ____________________________________________   FINAL CLINICAL IMPRESSION(S) / ED DIAGNOSES  Final diagnoses:  Influenza A  RLQ abdominal pain      NEW MEDICATIONS STARTED DURING THIS VISIT:  Discharge Medication List as of 07/03/2018  5:32 PM    START taking these medications   Details  benzonatate (TESSALON) 200 MG capsule Take 1 capsule (200 mg total) by mouth 3 (three) times daily as needed for cough., Starting Sat 07/03/2018, Normal         Note:  This document was prepared using Dragon voice recognition software and may include unintentional dictation errors.    Versie Starks, PA-C 07/03/18 1833    Nena Polio, MD 07/04/18 [phone]

## 2018-07-03 NOTE — Discharge Instructions (Addendum)
Follow-up with your regular doctor if not better in 3 days.  Return emergency department worsening.  Follow-up with your regular doctor in 10 days for repeat check of your white blood cell count.  Take the [facility] for cough as needed.  Over-the-counter TheraFlu.  Drink plenty of fluids.

## 2018-07-03 NOTE — ED Triage Notes (Signed)
Pt to ed with c/o cough, congestion, body aches, weakness that started about 2 days ago.  Pt also reports nausea but denies vomiting.

## 2019-12-12 IMAGING — CR DG CHEST 2V
2 series · 2 of 2 positions shown · non-contrast
Comparison: 10/21/2016 and earlier.

CLINICAL DATA: Acute onset of cough and sinonasal congestion that
began yesterday. Current smoker.

EXAM:
CHEST - 2 VIEW

[chest pa]
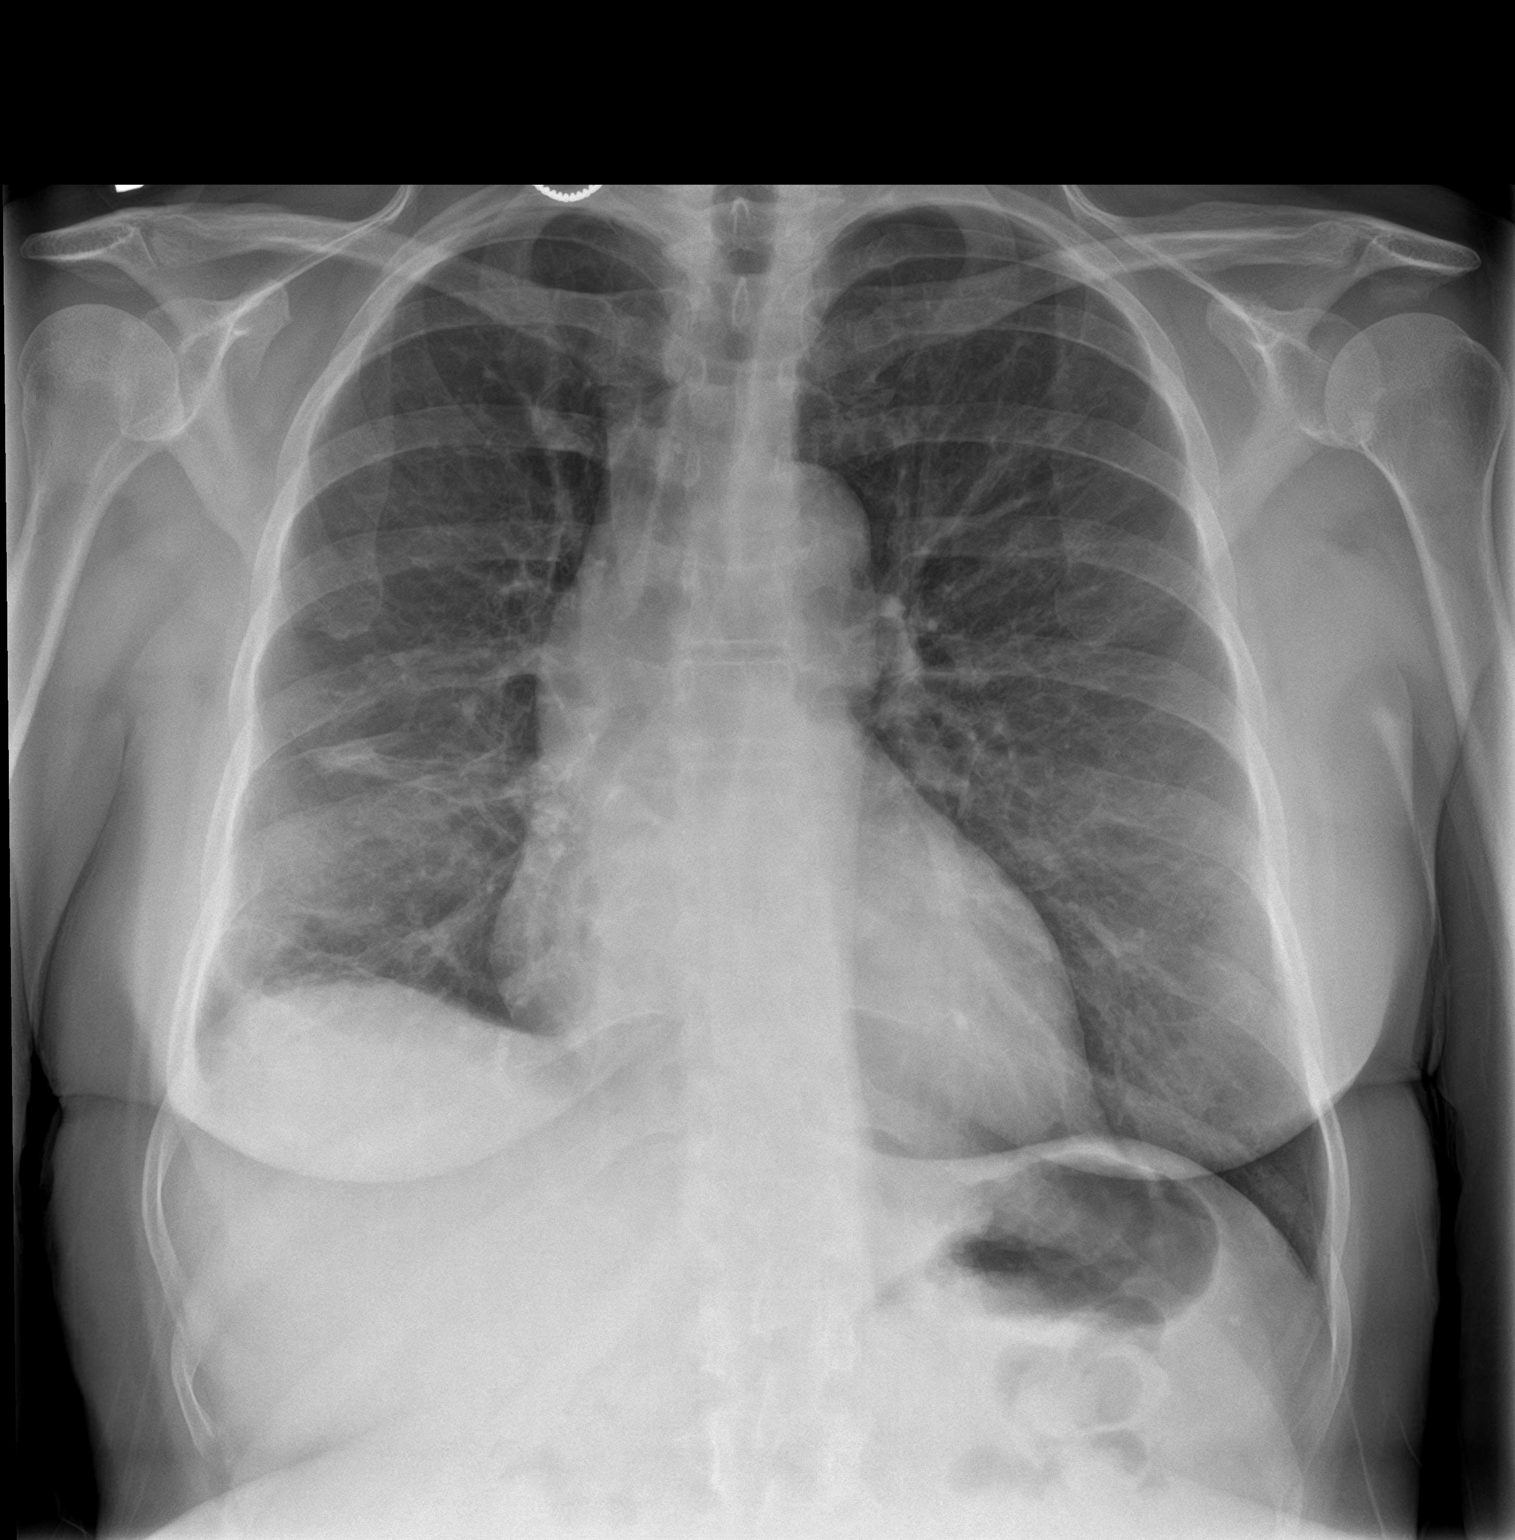

[chest lat]
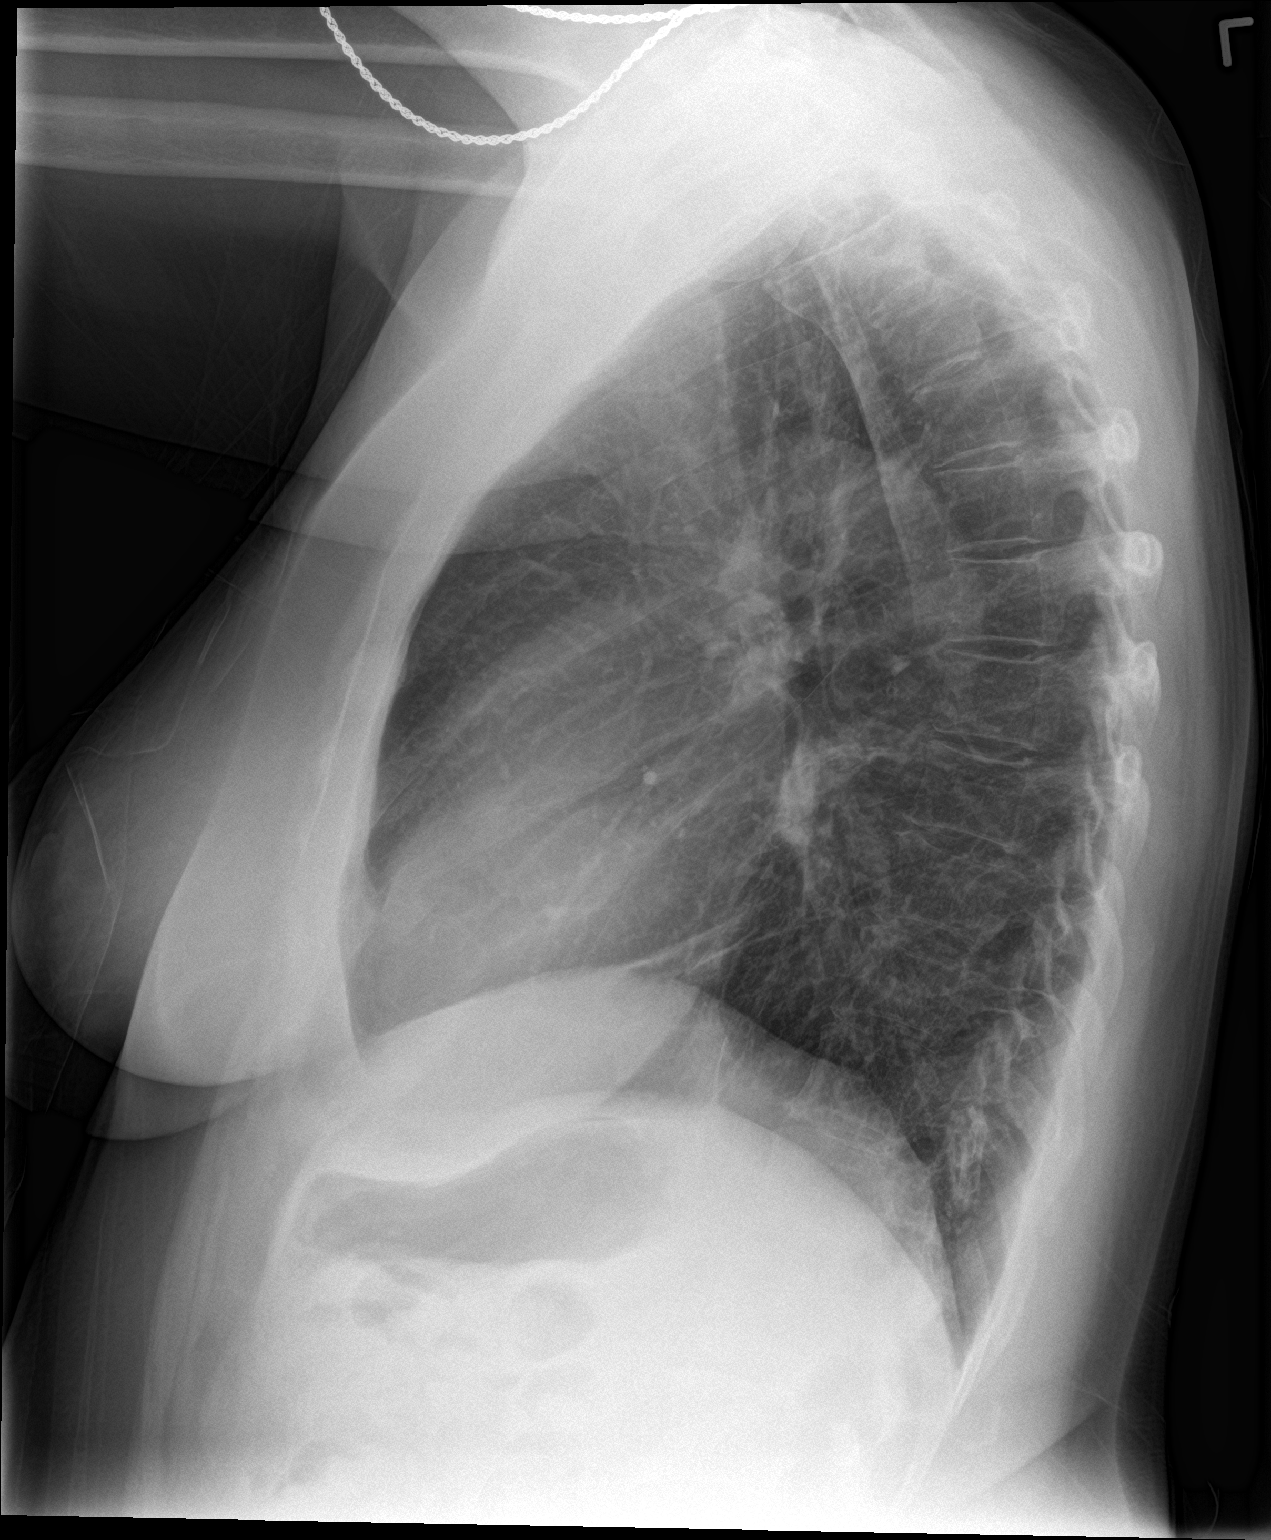

[2 of 2 positions shown; findings below may reference images not displayed]

FINDINGS: Cardiomediastinal silhouette unremarkable and unchanged.
Pleuroparenchymal scarring on the RIGHT with involvement of the
RIGHT base and the SUPERIOR segment RIGHT LOWER LOBE, unchanged.
Lungs otherwise clear. No localized airspace consolidation. No
pleural effusions. No pneumothorax. Normal pulmonary vascularity.
Visualized bony thorax intact.
IMPRESSION: No acute cardiopulmonary disease.

## 2020-04-05 ENCOUNTER — Other Ambulatory Visit: Payer: Self-pay

## 2020-04-05 ENCOUNTER — Encounter: Payer: Self-pay | Admitting: Internal Medicine

## 2020-04-05 ENCOUNTER — Ambulatory Visit (INDEPENDENT_AMBULATORY_CARE_PROVIDER_SITE_OTHER): Payer: BC Managed Care – PPO | Admitting: Internal Medicine

## 2020-04-05 VITALS — BP 150/96 | HR 67 | Temp 97.8°F | Wt 170.0 lb

## 2020-04-05 DIAGNOSIS — E039 Hypothyroidism, unspecified: Secondary | ICD-10-CM | POA: Insufficient documentation

## 2020-04-05 DIAGNOSIS — M8949 Other hypertrophic osteoarthropathy, multiple sites: Secondary | ICD-10-CM

## 2020-04-05 DIAGNOSIS — E7800 Pure hypercholesterolemia, unspecified: Secondary | ICD-10-CM

## 2020-04-05 DIAGNOSIS — M159 Polyosteoarthritis, unspecified: Secondary | ICD-10-CM

## 2020-04-05 DIAGNOSIS — I219 Acute myocardial infarction, unspecified: Secondary | ICD-10-CM

## 2020-04-05 DIAGNOSIS — I10 Essential (primary) hypertension: Secondary | ICD-10-CM

## 2020-04-05 DIAGNOSIS — J411 Mucopurulent chronic bronchitis: Secondary | ICD-10-CM | POA: Diagnosis not present

## 2020-04-05 DIAGNOSIS — M1990 Unspecified osteoarthritis, unspecified site: Secondary | ICD-10-CM | POA: Insufficient documentation

## 2020-04-05 DIAGNOSIS — J449 Chronic obstructive pulmonary disease, unspecified: Secondary | ICD-10-CM | POA: Insufficient documentation

## 2020-04-05 DIAGNOSIS — G43C1 Periodic headache syndromes in child or adult, intractable: Secondary | ICD-10-CM | POA: Diagnosis not present

## 2020-04-05 DIAGNOSIS — K219 Gastro-esophageal reflux disease without esophagitis: Secondary | ICD-10-CM

## 2020-04-05 DIAGNOSIS — E785 Hyperlipidemia, unspecified: Secondary | ICD-10-CM | POA: Insufficient documentation

## 2020-04-05 DIAGNOSIS — G43909 Migraine, unspecified, not intractable, without status migrainosus: Secondary | ICD-10-CM | POA: Insufficient documentation

## 2020-04-05 LAB — TSH: TSH: 3.32 u[IU]/mL (ref 0.35–4.50)

## 2020-04-05 LAB — T4, FREE: Free T4: 0.96 ng/dL (ref 0.60–1.60)

## 2020-04-05 MED ORDER — OMEPRAZOLE 20 MG PO CPDR: 20.0000 mg | DELAYED_RELEASE_CAPSULE | Freq: Every day | ORAL | 1 refills | Status: DC

## 2020-04-05 MED ORDER — ALBUTEROL SULFATE HFA 108 (90 BASE) MCG/ACT IN AERS: 2.0000 | INHALATION_SPRAY | Freq: Four times a day (QID) | RESPIRATORY_TRACT | 2 refills | Status: DC | PRN

## 2020-04-05 MED ORDER — HYDROCHLOROTHIAZIDE 25 MG PO TABS: 25.0000 mg | ORAL_TABLET | Freq: Every day | ORAL | 1 refills | Status: DC

## 2020-04-05 MED ORDER — LOSARTAN POTASSIUM 100 MG PO TABS: 100.0000 mg | ORAL_TABLET | Freq: Every day | ORAL | 1 refills | Status: DC

## 2020-04-05 MED ORDER — BENZONATATE 200 MG PO CAPS: 200.0000 mg | ORAL_CAPSULE | Freq: Three times a day (TID) | ORAL | 2 refills | Status: DC | PRN

## 2020-04-05 NOTE — Assessment & Plan Note (Signed)
Improved with daith piercing  Avoid overuse of NSAID's

## 2020-04-05 NOTE — Assessment & Plan Note (Signed)
Will check CMET and Lipid profile with annual exam Encouraged low fat diet

## 2020-04-05 NOTE — Assessment & Plan Note (Signed)
Encouraged smoking cessation Albuterol and Tessalon refilled

## 2020-04-05 NOTE — Assessment & Plan Note (Signed)
Will trial Omeprazole 20 mg daily, to prevent gastritis given NSAID use

## 2020-04-05 NOTE — Assessment & Plan Note (Signed)
Uncontrolled on Losartan, refilled today Refilled HCTZ, take on days you don't travel Encouraged DASH diet Will monitor

## 2020-04-05 NOTE — Assessment & Plan Note (Signed)
Avoid overuse of NSAID's Will start Omeprazole 20 mg daily in pm

## 2020-04-05 NOTE — Progress Notes (Signed)
HPI  Pt presents to the clinic today to establish care and for  Anemia: Her last H/H was 13.3/41.3, 06/2018.  OA: Mainly in her hands, lower spine and right knee. She takes Goody powders and Ibuprofen as needed with some relief.  COPD: She reports chronic cough but denies SOB. She is currently smoking, weaning herself down. She is not using any inhalers, would like a RX for Albuterol and Tessalon. There are no PFT's on file.  Migraines: These occur about once in the last 6 months. She is not sure what triggers. She got a daith piercing which helped and takes Goody's powders as needed.  HTN: Her BP today is 150/76. She is taking Losartan as prescribed. ECG from 10/2017 reviewed.  HLD s/p MI: There is no lipid profile on file. She is not taking any cholesterol lowering medication at this time. She tries to consume a low fat diet.   GERD: Triggered by meat and stress. She is prescribed Pantoprazole and Famotidine but has not been taking these. There is no upper GI on file.   Hypothyroidism: She denies any issues on her current dose of Euthyrox. She is not following with endocrinology.  Flu: never Tetanus: > 10 years ago Covid: never Pneumovax: never Pap Smear: 2019, [facility] Mammogram: never Colon Screening: never  Past Medical History:  Diagnosis Date  . Anemia   . Arthritis   . Cancer ([place])   . Chronic bronchitis ([place])   . Headache    migraines  . Heart murmur   . Hypertension   . Myocardial infarction ([place]) 09/2016   MILD PER PT-WHEN READING THRU HOSPITAL H&P IT LOOKS AS THOUGH PT DID NOT HAVE MI-TROPONINS WERE ELEVATED BUT STRESS WAS NEGATIVE    Current Outpatient Medications  Medication Sig Dispense Refill  . albuterol (PROVENTIL HFA;VENTOLIN HFA) 108 (90 Base) MCG/ACT inhaler Inhale 2 puffs into the lungs every 6 (six) hours as needed for wheezing or shortness of breath. 1 Inhaler 0  . benzonatate (TESSALON) 200 MG capsule Take 1 capsule (200 mg total) by mouth 3 (three)  times daily as needed for cough. 30 capsule 0  . docusate sodium (COLACE) 100 MG capsule Take 1 capsule (100 mg total) by mouth 2 (two) times daily. To keep stools soft 30 capsule 0  . felodipine (PLENDIL) 5 MG 24 hr tablet Take 5 mg by mouth every evening.    . hydrochlorothiazide (HYDRODIURIL) 25 MG tablet Take 1 tablet (25 mg total) by mouth daily. 30 tablet 1  . ibuprofen (ADVIL,MOTRIN) 800 MG tablet Take 1 tablet (800 mg total) by mouth every 8 (eight) hours as needed for moderate pain. 30 tablet 1  . levothyroxine (SYNTHROID, LEVOTHROID) 25 MCG tablet Take 25 mcg by mouth daily before breakfast.    . oxycodone (OXY-IR) 5 MG capsule Take 1 capsule (5 mg total) by mouth every 6 (six) hours as needed for pain. 15 capsule 0  . pantoprazole (PROTONIX) 40 MG tablet Take 40 mg by mouth daily.    . ranitidine (ZANTAC) 150 MG tablet Take 150 mg by mouth every morning.    . sulfamethoxazole-trimethoprim (BACTRIM DS,SEPTRA DS) 800-160 MG tablet Take 1 tablet by mouth 2 (two) times daily. 14 tablet 0   No current facility-administered medications for this visit.    Allergies  Allergen Reactions  . Amlodipine Nausea And Vomiting  . Lisinopril Cough  . Shellfish Allergy Hives    Family History  Problem Relation Age of Onset  . Hypertension Mother   .  Atrial fibrillation Mother   . Heart disease Maternal Grandmother     Social History   Socioeconomic History  . Marital status: Single    Spouse name: Not on file  . Number of children: Not on file  . Years of education: Not on file  . Highest education level: Not on file  Occupational History  . Not on file  Tobacco Use  . Smoking status: Current Some Day Smoker    Packs/day: 0.50    Years: 35.00    Pack years: 17.50    Types: Cigarettes  . Smokeless tobacco: Never Used  Vaping Use  . Vaping Use: Never used  Substance and Sexual Activity  . Alcohol use: No  . Drug use: No  . Sexual activity: Not on file  Other Topics Concern   . Not on file  Social History Narrative  . Not on file   Social Determinants of Health   Financial Resource Strain:   . Difficulty of Paying Living Expenses: Not on file  Food Insecurity:   . Worried About Charity fundraiser in the Last Year: Not on file  . Ran Out of Food in the Last Year: Not on file  Transportation Needs:   . Lack of Transportation (Medical): Not on file  . Lack of Transportation (Non-Medical): Not on file  Physical Activity:   . Days of Exercise per Week: Not on file  . Minutes of Exercise per Session: Not on file  Stress:   . Feeling of Stress : Not on file  Social Connections:   . Frequency of Communication with Friends and Family: Not on file  . Frequency of Social Gatherings with Friends and Family: Not on file  . Attends Religious Services: Not on file  . Active Member of Clubs or Organizations: Not on file  . Attends Archivist Meetings: Not on file  . Marital Status: Not on file  Intimate Partner Violence:   . Fear of Current or Ex-Partner: Not on file  . Emotionally Abused: Not on file  . Physically Abused: Not on file  . Sexually Abused: Not on file    ROS:  Constitutional: Pt reports headaches.  Denies fever, malaise, fatigue, headache or abrupt weight changes.  HEENT: Denies eye pain, eye redness, ear pain, ringing in the ears, wax buildup, runny nose, nasal congestion, bloody nose, or sore throat. Respiratory: Pt reports chronic cough. Denies difficulty breathing, shortness of breath, or sputum production.   Cardiovascular: Denies chest pain, chest tightness, palpitations or swelling in the hands or feet.  Gastrointestinal: Pt reports reflux. Denies abdominal pain, bloating, constipation, diarrhea or blood in the stool.  GU: Denies frequency, urgency, pain with urination, blood in urine, odor or discharge. Musculoskeletal: Pt reports intermittent joint pain. Denies decrease in range of motion, difficulty with gait, muscle pain or  joint swelling.  Skin: Denies redness, rashes, lesions or ulcercations.  Neurological: Denies dizziness, difficulty with memory, difficulty with speech or problems with balance and coordination.  Psych: Denies anxiety, depression, SI/HI.  No other specific complaints in a complete review of systems (except as listed in HPI above).  PE:  BP (!) 150/96   Pulse 67   Temp 97.8 F (36.6 C) (Temporal)   Wt 170 lb (77.1 kg)   SpO2 97%   BMI 26.63 kg/m   Wt Readings from Last 3 Encounters:  07/03/18 170 lb (77.1 kg)  06/23/18 170 lb (77.1 kg)  05/20/18 175 lb (79.4 kg)  General: Appears her stated age, well developed, well nourished in NAD. Skin: Dry and intact. HEENT: Head: normal shape and size; Eyes:  EOMs intact; Ears: Tm's gray and intact, normal light reflex; Cardiovascular: Normal rate. Pulmonary/Chest: Normal effort.  Musculoskeletal:  No difficulty with gait.  Neurological: Alert and oriented.  Psychiatric: Mood and affect mildly flat. Behavior is normal. Judgment and thought content normal.     BMET    Component Value Date/Time   NA 137 07/03/2018 1411   NA 142 10/18/2011 1653   K 3.7 07/03/2018 1411   K 4.0 10/18/2011 1653   CL 104 07/03/2018 1411   CL 110 (H) 10/18/2011 1653   CO2 22 07/03/2018 1411   CO2 24 10/18/2011 1653   GLUCOSE 88 07/03/2018 1411   GLUCOSE 87 10/18/2011 1653   BUN 8 07/03/2018 1411   BUN 7 10/18/2011 1653   CREATININE 0.83 07/03/2018 1411   CREATININE 0.73 10/18/2011 1653   CALCIUM 8.7 (L) 07/03/2018 1411   CALCIUM 8.3 (L) 10/18/2011 1653   GFRNONAA >60 07/03/2018 1411   GFRNONAA >60 10/18/2011 1653   GFRAA >60 07/03/2018 1411   GFRAA >60 10/18/2011 1653    Lipid Panel  No results found for: CHOL, TRIG, HDL, CHOLHDL, VLDL, LDLCALC  CBC    Component Value Date/Time   WBC 2.6 (L) 07/03/2018 1411   RBC 4.65 07/03/2018 1411   HGB 13.3 07/03/2018 1411   HGB 11.6 (L) 10/18/2011 1653   HCT 41.3 07/03/2018 1411   HCT 35.2  10/18/2011 1653   PLT 158 07/03/2018 1411   PLT 181 10/18/2011 1653   MCV 88.8 07/03/2018 1411   MCV 90 10/18/2011 1653   MCH 28.6 07/03/2018 1411   MCHC 32.2 07/03/2018 1411   RDW 15.2 07/03/2018 1411   RDW 13.8 10/18/2011 1653   LYMPHSABS 0.8 07/03/2018 1411   MONOABS 0.6 07/03/2018 1411   EOSABS 0.0 07/03/2018 1411   BASOSABS 0.0 07/03/2018 1411    Hgb A1C No results found for: HGBA1C   Assessment and Plan:

## 2020-04-05 NOTE — Patient Instructions (Signed)

## 2020-04-05 NOTE — Assessment & Plan Note (Signed)
Not on beta blocker Poorly controlled HTN, Losartan and HCTZ refilled Continue baby ASA daily

## 2020-04-05 NOTE — Assessment & Plan Note (Signed)
TSH and Free T4 today Will adjust Euthyrox if needed and refill based on labs

## 2020-04-09 ENCOUNTER — Encounter: Payer: Self-pay | Admitting: Internal Medicine

## 2020-04-09 MED ORDER — EUTHYROX 75 MCG PO TABS: 75.0000 ug | ORAL_TABLET | Freq: Every day | ORAL | 1 refills | Status: DC

## 2020-06-15 ENCOUNTER — Ambulatory Visit: Payer: PRIVATE HEALTH INSURANCE | Admitting: Internal Medicine

## 2020-10-05 ENCOUNTER — Ambulatory Visit: Payer: BC Managed Care – PPO | Admitting: Internal Medicine

## 2020-10-17 ENCOUNTER — Telehealth: Payer: Self-pay | Admitting: Internal Medicine

## 2020-10-17 MED ORDER — EUTHYROX 75 MCG PO TABS: 75.0000 ug | ORAL_TABLET | Freq: Every day | ORAL | 1 refills | Status: DC

## 2020-10-17 NOTE — Telephone Encounter (Signed)
  LAST APPOINTMENT DATE: Visit date not found   NEXT APPOINTMENT DATE:@Visit  date not found  MEDICATION: EUTHYROX 75 MCG tablet  PHARMACY: [facility] [address]  Let patient know to contact pharmacy at the end of the day to make sure medication is ready.  Please notify patient to allow 48-72 hours to process  Encourage patient to contact the pharmacy for refills or they can request refills through [place]:   LAST REFILL:  QTY:  REFILL DATE:    OTHER COMMENTS:    Okay for refill?  Please advise

## 2021-03-14 ENCOUNTER — Other Ambulatory Visit: Payer: Self-pay | Admitting: Internal Medicine

## 2021-03-14 NOTE — Telephone Encounter (Signed)
  Encourage patient to contact the pharmacy for refills or they can request refills through [place]:  Please schedule appointment if longer than 1 year  NEXT APPOINTMENT DATE:05/02/21  MEDICATION:losartan (COZAAR) 100 MG tablet,EUTHYROX 75 MCG tablet  Is the patient out of medication?   [facility] (N), [place] [facility] [place]  Let patient know to contact pharmacy at the end of the day to make sure medication is ready.  Please notify patient to allow 48-72 hours to process  CLINICAL FILLS OUT ALL BELOW:   LAST REFILL:  QTY:  REFILL DATE:    OTHER COMMENTS:    Okay for refill?  Please advise

## 2021-03-14 NOTE — Telephone Encounter (Signed)
Name of Medication: Euthyrox 75 mcg Name of Pharmacy: [facility] [address] Last Fill or Written Date and Quantity: # 90 x 1 on 10/17/2020 Last Office Visit and Type: 04/05/2020 establish care Next Office Visit and Type: 05/02/2021 TOC to Romilda Garret NP   Name of Medication: Losartan 100 mg Name of Pharmacy: [facility] [address] Last Fill or Written Date and Quantity: # 90 x 1 on 04/05/2020 Last Office Visit and Type: 04/05/2020 establish care Next Office Visit and Type: 05/02/2021 TOC to Romilda Garret NP  Sending note to [place].

## 2021-03-15 MED ORDER — EUTHYROX 75 MCG PO TABS: 75.0000 ug | ORAL_TABLET | Freq: Every day | ORAL | 1 refills | Status: DC

## 2021-03-15 MED ORDER — LOSARTAN POTASSIUM 100 MG PO TABS: 100.0000 mg | ORAL_TABLET | Freq: Every day | ORAL | 1 refills | Status: DC

## 2021-03-20 ENCOUNTER — Telehealth: Payer: Self-pay

## 2021-03-20 NOTE — Telephone Encounter (Signed)
Received fax from [facility] asking if it is ok to change Euthyrox (this medication has been D/C), to Levothyroxine Alragen Brand?  Last filled by [facility] Quint  Has [facility] appointment with Romilda Garret on 05/02/21

## 2021-03-20 NOTE — Telephone Encounter (Signed)
That's fine

## 2021-05-02 ENCOUNTER — Encounter: Payer: Self-pay | Admitting: Nurse Practitioner

## 2021-06-20 ENCOUNTER — Encounter: Payer: BC Managed Care – PPO | Admitting: Nurse Practitioner

## 2021-07-25 ENCOUNTER — Ambulatory Visit: Payer: BC Managed Care – PPO | Admitting: Nurse Practitioner

## 2021-07-25 ENCOUNTER — Other Ambulatory Visit: Payer: Self-pay | Admitting: Nurse Practitioner

## 2021-07-25 ENCOUNTER — Other Ambulatory Visit: Payer: Self-pay

## 2021-07-25 ENCOUNTER — Encounter: Payer: Self-pay | Admitting: Nurse Practitioner

## 2021-07-25 VITALS — BP 158/104 | HR 79 | Temp 97.0°F | Resp 12 | Ht 67.0 in | Wt 159.2 lb

## 2021-07-25 DIAGNOSIS — E039 Hypothyroidism, unspecified: Secondary | ICD-10-CM

## 2021-07-25 DIAGNOSIS — Z862 Personal history of diseases of the blood and blood-forming organs and certain disorders involving the immune mechanism: Secondary | ICD-10-CM | POA: Diagnosis not present

## 2021-07-25 DIAGNOSIS — Z23 Encounter for immunization: Secondary | ICD-10-CM | POA: Diagnosis not present

## 2021-07-25 DIAGNOSIS — J411 Mucopurulent chronic bronchitis: Secondary | ICD-10-CM | POA: Diagnosis not present

## 2021-07-25 DIAGNOSIS — I10 Essential (primary) hypertension: Secondary | ICD-10-CM | POA: Diagnosis not present

## 2021-07-25 DIAGNOSIS — E7800 Pure hypercholesterolemia, unspecified: Secondary | ICD-10-CM

## 2021-07-25 DIAGNOSIS — G43C1 Periodic headache syndromes in child or adult, intractable: Secondary | ICD-10-CM

## 2021-07-25 DIAGNOSIS — Z833 Family history of diabetes mellitus: Secondary | ICD-10-CM

## 2021-07-25 DIAGNOSIS — E611 Iron deficiency: Secondary | ICD-10-CM

## 2021-07-25 DIAGNOSIS — H6991 Unspecified Eustachian tube disorder, right ear: Secondary | ICD-10-CM

## 2021-07-25 DIAGNOSIS — K219 Gastro-esophageal reflux disease without esophagitis: Secondary | ICD-10-CM

## 2021-07-25 LAB — CBC WITH DIFFERENTIAL/PLATELET
Basophils Absolute: 0.1 10*3/uL (ref 0.0–0.1)
Basophils Relative: 1.2 % (ref 0.0–3.0)
Eosinophils Absolute: 0.3 10*3/uL (ref 0.0–0.7)
Eosinophils Relative: 4.7 % (ref 0.0–5.0)
HCT: 39.7 % (ref 36.0–46.0)
Hemoglobin: 12.9 g/dL (ref 12.0–15.0)
Lymphocytes Relative: 40.4 % (ref 12.0–46.0)
Lymphs Abs: 3 10*3/uL (ref 0.7–4.0)
MCHC: 32.5 g/dL (ref 30.0–36.0)
MCV: 86.9 fl (ref 78.0–100.0)
Monocytes Absolute: 0.8 10*3/uL (ref 0.1–1.0)
Monocytes Relative: 10.4 % (ref 3.0–12.0)
Neutro Abs: 3.2 10*3/uL (ref 1.4–7.7)
Neutrophils Relative %: 43.3 % (ref 43.0–77.0)
Platelets: 202 10*3/uL (ref 150.0–400.0)
RBC: 4.57 Mil/uL (ref 3.87–5.11)
RDW: 14.6 % (ref 11.5–15.5)
WBC: 7.3 10*3/uL (ref 4.0–10.5)

## 2021-07-25 LAB — COMPREHENSIVE METABOLIC PANEL
ALT: 16 U/L (ref 0–35)
AST: 17 U/L (ref 0–37)
Albumin: 4.1 g/dL (ref 3.5–5.2)
Alkaline Phosphatase: 85 U/L (ref 39–117)
BUN: 10 mg/dL (ref 6–23)
CO2: 32 mEq/L (ref 19–32)
Calcium: 9.1 mg/dL (ref 8.4–10.5)
Chloride: 104 mEq/L (ref 96–112)
Creatinine, Ser: 0.75 mg/dL (ref 0.40–1.20)
GFR: 89.87 mL/min (ref >60.00)
Glucose, Bld: 86 mg/dL (ref 70–99)
Potassium: 4.9 mEq/L (ref 3.5–5.1)
Sodium: 141 mEq/L (ref 135–145)
Total Bilirubin: 0.3 mg/dL (ref 0.2–1.2)
Total Protein: 6.9 g/dL (ref 6.0–8.3)

## 2021-07-25 LAB — TSH: TSH: 3.06 u[IU]/mL (ref 0.35–5.50)

## 2021-07-25 LAB — LIPID PANEL
Cholesterol: 174 mg/dL (ref 0–200)
HDL: 52.4 mg/dL (ref >39.00)
LDL Cholesterol: 99 mg/dL (ref 0–99)
NonHDL: 121.59
Total CHOL/HDL Ratio: 3
Triglycerides: 111 mg/dL (ref 0.0–149.0)
VLDL: 22.2 mg/dL (ref 0.0–40.0)

## 2021-07-25 LAB — VITAMIN B12: Vitamin B-12: 269 pg/mL (ref 211–911)

## 2021-07-25 LAB — IBC + FERRITIN
Ferritin: 15.3 ng/mL (ref 10.0–291.0)
Iron: 35 ug/dL — ABNORMAL LOW (ref 42–145)
Saturation Ratios: 9.4 % — ABNORMAL LOW (ref 20.0–50.0)
TIBC: 373.8 ug/dL (ref 250.0–450.0)
Transferrin: 267 mg/dL (ref 212.0–360.0)

## 2021-07-25 LAB — HEMOGLOBIN A1C: Hgb A1c MFr Bld: 5.8 % (ref 4.6–6.5)

## 2021-07-25 LAB — FOLATE: Folate: 20.2 ng/mL (ref >5.9)

## 2021-07-25 MED ORDER — LOSARTAN POTASSIUM 25 MG PO TABS: 25.0000 mg | ORAL_TABLET | Freq: Every day | ORAL | 0 refills | Status: DC

## 2021-07-25 MED ORDER — EUTHYROX 75 MCG PO TABS: 75.0000 ug | ORAL_TABLET | Freq: Every day | ORAL | 1 refills | Status: DC

## 2021-07-25 MED ORDER — ALBUTEROL SULFATE HFA 108 (90 BASE) MCG/ACT IN AERS: 2.0000 | INHALATION_SPRAY | Freq: Four times a day (QID) | RESPIRATORY_TRACT | 2 refills | Status: DC | PRN

## 2021-07-25 MED ORDER — IRON (FERROUS SULFATE) 325 (65 FE) MG PO TABS: 325.0000 mg | ORAL_TABLET | Freq: Every day | ORAL | 1 refills | Status: DC

## 2021-07-25 MED ORDER — HYDROCHLOROTHIAZIDE 25 MG PO TABS: 25.0000 mg | ORAL_TABLET | Freq: Every day | ORAL | 1 refills | Status: AC

## 2021-07-25 MED ORDER — FLUTICASONE PROPIONATE 50 MCG/ACT NA SUSP: 2.0000 | Freq: Every day | NASAL | 0 refills | Status: DC

## 2021-07-25 MED ORDER — BENZONATATE 200 MG PO CAPS: 200.0000 mg | ORAL_CAPSULE | Freq: Three times a day (TID) | ORAL | 2 refills | Status: DC | PRN

## 2021-07-25 NOTE — Patient Instructions (Signed)
Nice to see you today. ?I will be in touch with the lab resutls once I have them ?I want to see you in 1 month for blood pressure recheck and lab redraw. ?Follow up sooner if you need me ?
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
## 2021-07-25 NOTE — Assessment & Plan Note (Signed)
These degeneration above degeneration below patient.  Patient states that she has been prediabetic or borderline in the past.  Pending lab results today ?

## 2021-07-25 NOTE — Assessment & Plan Note (Signed)
States history of anemia feel he is gotten worse as of late.  Pending labs today ?

## 2021-07-25 NOTE — Assessment & Plan Note (Signed)
Historical diagnosis.  Patient states she never had any trouble with cholesterol and was on cholesterol medication.  Pending lab results today. ?

## 2021-07-25 NOTE — Assessment & Plan Note (Signed)
Patient was maintained on losartan 100 mg and HCTZ 25 mg.  Patient has been taking losartan sporadically inclusive of up to 100 mg or 50 mg.  States every time he takes the medication it makes her dizzy.  Did have discussion we will continue her on HCTZ 25 mg and restart losartan 25 mg daily.  Patient does not have the ability to check blood pressure at home we will have her follow-up 1 month for BMP recheck and blood pressure recheck likely need to be titrated up higher.  Asymptomatic in office ?

## 2021-07-25 NOTE — Assessment & Plan Note (Signed)
Currently maintained on Euthyrox 75 mcg.  Patient taking medication as prescribed.  We will check TSH in office today pending lab results.  Medication as prescribed ?

## 2021-07-25 NOTE — Assessment & Plan Note (Signed)
Has had approximate 3 migraines in the past year.  States she got her ear pierced and that has helped tremendously.  She has been on some medication in the past prevent headaches from patient report sounds like a TCA because it helped her sleep but did cause her to be too sleepy to get up and function for work and life. ?

## 2021-07-25 NOTE — Assessment & Plan Note (Signed)
Still currently smoking.  Patient uses albuterol inhaler as needed states she has not used it in extended period time but recently been out of medication.  We will refill her albuterol inhaler for as needed use. ?

## 2021-07-25 NOTE — Assessment & Plan Note (Signed)
Omeprazole in the past the patient thinks the medication gave her sensitivity very.  Still sensitive to the area since discontinuation of medication.  States does not take any medication currently for her reflux symptoms.  Did inform her that using Goody powders can aid in reflux and belly aggravation. ?

## 2021-07-25 NOTE — Progress Notes (Signed)
Orders only

## 2021-08-10 ENCOUNTER — Emergency Department: Payer: BC Managed Care – PPO

## 2021-08-10 ENCOUNTER — Other Ambulatory Visit: Payer: Self-pay

## 2021-08-10 ENCOUNTER — Emergency Department
Admission: EM | Admit: 2021-08-10 | Discharge: 2021-08-10 | Disposition: A | Discharge status: Home / Self Care | Payer: BC Managed Care – PPO | Attending: Emergency Medicine | Admitting: Emergency Medicine

## 2021-08-10 DIAGNOSIS — S39012A Strain of muscle, fascia and tendon of lower back, initial encounter: Secondary | ICD-10-CM

## 2021-08-10 DIAGNOSIS — J449 Chronic obstructive pulmonary disease, unspecified: Secondary | ICD-10-CM | POA: Diagnosis not present

## 2021-08-10 DIAGNOSIS — X58XXXA Exposure to other specified factors, initial encounter: Secondary | ICD-10-CM | POA: Insufficient documentation

## 2021-08-10 DIAGNOSIS — R1032 Left lower quadrant pain: Secondary | ICD-10-CM | POA: Diagnosis not present

## 2021-08-10 DIAGNOSIS — D7389 Other diseases of spleen: Secondary | ICD-10-CM | POA: Diagnosis not present

## 2021-08-10 DIAGNOSIS — R1031 Right lower quadrant pain: Secondary | ICD-10-CM | POA: Insufficient documentation

## 2021-08-10 DIAGNOSIS — S3992XA Unspecified injury of lower back, initial encounter: Secondary | ICD-10-CM | POA: Diagnosis not present

## 2021-08-10 DIAGNOSIS — I10 Essential (primary) hypertension: Secondary | ICD-10-CM | POA: Insufficient documentation

## 2021-08-10 DIAGNOSIS — R109 Unspecified abdominal pain: Secondary | ICD-10-CM

## 2021-08-10 LAB — CBC WITH DIFFERENTIAL/PLATELET
Abs Immature Granulocytes: 0.02 10*3/uL (ref 0.00–0.07)
Basophils Absolute: 0.1 10*3/uL (ref 0.0–0.1)
Basophils Relative: 1 %
Eosinophils Absolute: 0.3 10*3/uL (ref 0.0–0.5)
Eosinophils Relative: 4 %
HCT: 38.7 % (ref 36.0–46.0)
Hemoglobin: 12.3 g/dL (ref 12.0–15.0)
Immature Granulocytes: 0 %
Lymphocytes Relative: 36 %
Lymphs Abs: 3.2 10*3/uL (ref 0.7–4.0)
MCH: 27.8 pg (ref 26.0–34.0)
MCHC: 31.8 g/dL (ref 30.0–36.0)
MCV: 87.6 fL (ref 80.0–100.0)
Monocytes Absolute: 0.9 10*3/uL (ref 0.1–1.0)
Monocytes Relative: 10 %
Neutro Abs: 4.3 10*3/uL (ref 1.7–7.7)
Neutrophils Relative %: 49 %
Platelets: 205 10*3/uL (ref 150–400)
RBC: 4.42 MIL/uL (ref 3.87–5.11)
RDW: 14.4 % (ref 11.5–15.5)
WBC: 8.7 10*3/uL (ref 4.0–10.5)
nRBC: 0 % (ref 0.0–0.2)

## 2021-08-10 LAB — URINALYSIS, ROUTINE W REFLEX MICROSCOPIC
Bilirubin Urine: NEGATIVE
Glucose, UA: NEGATIVE mg/dL
Hgb urine dipstick: NEGATIVE
Ketones, ur: NEGATIVE mg/dL
Leukocytes,Ua: NEGATIVE
Nitrite: NEGATIVE
Protein, ur: NEGATIVE mg/dL
Specific Gravity, Urine: 1.015 (ref 1.005–1.030)
pH: 6 (ref 5.0–8.0)

## 2021-08-10 LAB — BASIC METABOLIC PANEL
Anion gap: 9 (ref 5–15)
BUN: 19 mg/dL (ref 6–20)
CO2: 28 mmol/L (ref 22–32)
Calcium: 9 mg/dL (ref 8.9–10.3)
Chloride: 98 mmol/L (ref 98–111)
Creatinine, Ser: 1.08 mg/dL — ABNORMAL HIGH (ref 0.44–1.00)
GFR, Estimated: >60 mL/min (ref >60)
Glucose, Bld: 93 mg/dL (ref 70–99)
Potassium: 3.9 mmol/L (ref 3.5–5.1)
Sodium: 135 mmol/L (ref 135–145)

## 2021-08-10 MED ORDER — LIDOCAINE 5 % EX PTCH: 1.0000 | MEDICATED_PATCH | Freq: Two times a day (BID) | CUTANEOUS | 0 refills | Status: DC

## 2021-08-10 MED ORDER — CYCLOBENZAPRINE HCL 5 MG PO TABS
5.0000 mg | ORAL_TABLET | Freq: Three times a day (TID) | ORAL | 0 refills | Status: DC | PRN
Start: 2021-08-10 — End: 2021-09-05

## 2021-08-10 NOTE — ED Provider Notes (Signed)
? ?[facility] ?Provider Note ? ? ? Event Date/Time  ? First MD Initiated Contact with Patient 08/10/21 [id number]   ?  (approximate) ? ? ?History  ? ?Chief Complaint ?Flank Pain ? ? ?HPI ? ?Dorothy Gay is a 56 y.o. female with past medical history of hypertension, hyperlipidemia, COPD, and migraines who presents to the ED complaining of flank and back pain.  Patient reports that she has been dealing with intermittent pain in the middle of her back and moving towards her right flank for about the past week.  Pain had been coming on at any time and is not exacerbated or alleviated by anything in particular.  She had an acute worsening of her pain last night with no inciting factor, describes pain as sharp and constant since then.  She is concerned that she could have a "kidney infection", but she denies any fevers, dysuria, or hematuria.  She states the pain radiates around into the bilateral lower quadrants of her abdomen, again greater on the right.  She denies any history of kidney stones, but does report that they run in her family.  She denies any nausea, vomiting, or changes in her bowel movements. ?  ? ? ?Physical Exam  ? ?Triage Vital Signs: ?ED Triage Vitals  ?Enc Vitals Group  ?   BP 08/10/21 0500 (!) 149/79  ?   Pulse Rate 08/10/21 0500 62  ?   Resp 08/10/21 0500 16  ?   Temp 08/10/21 0500 98.3 ?F (36.8 ?C)  ?   Temp Source 08/10/21 0500 Oral  ?   SpO2 08/10/21 0500 98 %  ?   Weight 08/10/21 0459 159 lb (72.1 kg)  ?   Height 08/10/21 0459 '5\' 7"'$  (1.702 m)  ?   Head Circumference --   ?   Peak Flow --   ?   Pain Score 08/10/21 0505 6  ?   Pain Loc --   ?   Pain Edu? --   ?   Excl. in [place]? --   ? ? ?Most recent vital signs: ?Vitals:  ? 08/10/21 0500 08/10/21 0600  ?BP: (!) 149/79 (!) 145/78  ?Pulse: 62 65  ?Resp: 16 16  ?Temp: 98.3 ?F (36.8 ?C) 98.3 ?F (36.8 ?C)  ?SpO2: 98% 99%  ? ? ?Constitutional: Alert and oriented. ?Eyes: Conjunctivae are normal. ?Head: Atraumatic. ?Nose: No  congestion/rhinnorhea. ?Mouth/Throat: Mucous membranes are moist.  ?Cardiovascular: Normal rate, regular rhythm. Grossly normal heart sounds.  2+ radial pulses bilaterally. ?Respiratory: Normal respiratory effort.  No retractions. Lungs CTAB. ?Gastrointestinal: Soft and tender to palpation in the right lower quadrant of her abdomen with no rebound or guarding. Right CVA tenderness noted. No distention. ?Musculoskeletal: No lower extremity tenderness nor edema. Midline lumbar spinal tenderness to palpation noted. ?Neurologic:  Normal speech and language. No gross focal neurologic deficits are appreciated. ? ? ? ?ED Results / Procedures / Treatments  ? ?Labs ?(all labs ordered are listed, but only abnormal results are displayed) ?Labs Reviewed  ?BASIC METABOLIC PANEL - Abnormal; Notable for the following components:  ?    Result Value  ? Creatinine, Ser 1.08 (*)   ? All other components within normal limits  ?URINALYSIS, ROUTINE W REFLEX MICROSCOPIC - Abnormal; Notable for the following components:  ? Color, Urine YELLOW (*)   ? APPearance HAZY (*)   ? All other components within normal limits  ?CBC WITH DIFFERENTIAL/PLATELET  ? ? ? ?RADIOLOGY ?CT renal protocol reviewed by me with  no obvious ureteral stone or hydronephrosis. ? ?PROCEDURES: ? ?Critical Care performed: No ? ?Procedures ? ? ?MEDICATIONS ORDERED IN ED: ?Medications - No data to display ? ? ?IMPRESSION / MDM / ASSESSMENT AND PLAN / ED COURSE  ?I reviewed the triage vital signs and the nursing notes. ?             ?               ? ?56 y.o. female with past medical history of hypertension, hyperlipidemia, COPD, and migraines who presents to the ED with back and flank pain that initially started intermittently but has been constant and severe overnight. ? ?Differential diagnosis includes, but is not limited to, lumbar strain, lumbar radiculopathy, cauda equina, kidney stone, pyelonephritis, appendicitis. ? ?Patient nontoxic-appearing and in no acute  distress, vital signs are unremarkable.  She has some mild tenderness to palpation in the right lower quadrant of her abdomen, more pronounced in the right costovertebral area.  She additionally is tender over her lumbar spine and we will need to further assess with CT for kidney stone or other acute process.  UA is pending and patient reports she is postmenopausal.  We will also check CBC and BMP, patient currently declines pain medication. ? ?CT renal protocol is unremarkable, CBC and BMP also reassuring with no anemia or electrolyte abnormality.  UA does not appear consistent with infection and given reassuring work-up, symptoms seem most consistent with lumbar strain or other musculoskeletal etiology.  No findings to suggest cauda equina and patient is appropriate for discharge home with symptomatic management.  She was counseled to follow-up with her PCP and to return to the ED for new worsening symptoms, patient agrees with plan. ? ?  ? ? ?FINAL CLINICAL IMPRESSION(S) / ED DIAGNOSES  ? ?Final diagnoses:  ?Strain of lumbar region, initial encounter  ?Flank pain  ? ? ? ?Rx / DC Orders  ? ?ED Discharge Orders   ? ?      Ordered  ?  lidocaine (LIDODERM) 5 %  Every 12 hours       ? 08/10/21 0654  ?  cyclobenzaprine (FLEXERIL) 5 MG tablet  3 times daily PRN       ? 08/10/21 0654  ? ?  ?  ? ?  ? ? ? ?Note:  This document was prepared using Dragon voice recognition software and may include unintentional dictation errors. ?  ?Blake Divine, MD ?08/10/21 [phone] ? ?

## 2021-08-10 NOTE — ED Triage Notes (Signed)
Presents to ED with lower back pain, midline radiating around both sides to abd. Onset 1 wk ago, intermittent throughout week, worsening tonight. Denies urinary sx, but states pain is worse after drinking water/soda until using restroom. Denies recent fall, injury.  ?

## 2021-08-12 ENCOUNTER — Other Ambulatory Visit: Payer: Self-pay | Admitting: Nurse Practitioner

## 2021-08-12 DIAGNOSIS — Z862 Personal history of diseases of the blood and blood-forming organs and certain disorders involving the immune mechanism: Secondary | ICD-10-CM

## 2021-08-29 ENCOUNTER — Telehealth: Payer: Self-pay

## 2021-08-29 ENCOUNTER — Other Ambulatory Visit (INDEPENDENT_AMBULATORY_CARE_PROVIDER_SITE_OTHER): Payer: BC Managed Care – PPO

## 2021-08-29 DIAGNOSIS — Z862 Personal history of diseases of the blood and blood-forming organs and certain disorders involving the immune mechanism: Secondary | ICD-10-CM | POA: Diagnosis not present

## 2021-08-29 LAB — IBC + FERRITIN
Ferritin: 17.8 ng/mL (ref 10.0–291.0)
Iron: 42 ug/dL (ref 42–145)
Saturation Ratios: 11.6 % — ABNORMAL LOW (ref 20.0–50.0)
TIBC: 361.2 ug/dL (ref 250.0–450.0)
Transferrin: 258 mg/dL (ref 212.0–360.0)

## 2021-08-29 NOTE — Telephone Encounter (Signed)
Patient came by today for lab work and B/P check. B/P reading was 156/78. Patient does not check her b/p at home. Please advise ? ?LOV was 07/25/21 for TOC. ?No future appointments made ?

## 2021-08-29 NOTE — Telephone Encounter (Signed)
Patient advised and appointment made for 09/05/21 ?

## 2021-08-29 NOTE — Telephone Encounter (Signed)
She will need an office visit please ?

## 2021-09-05 ENCOUNTER — Ambulatory Visit: Payer: BC Managed Care – PPO | Admitting: Nurse Practitioner

## 2021-09-05 ENCOUNTER — Encounter: Payer: Self-pay | Admitting: Nurse Practitioner

## 2021-09-05 VITALS — BP 152/80 | HR 64 | Temp 97.3°F | Resp 12 | Ht 67.0 in | Wt 161.0 lb

## 2021-09-05 DIAGNOSIS — J029 Acute pharyngitis, unspecified: Secondary | ICD-10-CM | POA: Diagnosis not present

## 2021-09-05 DIAGNOSIS — J069 Acute upper respiratory infection, unspecified: Secondary | ICD-10-CM

## 2021-09-05 DIAGNOSIS — R509 Fever, unspecified: Secondary | ICD-10-CM | POA: Insufficient documentation

## 2021-09-05 DIAGNOSIS — J3489 Other specified disorders of nose and nasal sinuses: Secondary | ICD-10-CM | POA: Diagnosis not present

## 2021-09-05 DIAGNOSIS — M791 Myalgia, unspecified site: Secondary | ICD-10-CM | POA: Diagnosis not present

## 2021-09-05 DIAGNOSIS — I10 Essential (primary) hypertension: Secondary | ICD-10-CM

## 2021-09-05 LAB — BASIC METABOLIC PANEL
BUN: 13 mg/dL (ref 6–23)
CO2: 29 mEq/L (ref 19–32)
Calcium: 9.2 mg/dL (ref 8.4–10.5)
Chloride: 100 mEq/L (ref 96–112)
Creatinine, Ser: 0.77 mg/dL (ref 0.40–1.20)
GFR: 87.01 mL/min (ref >60.00)
Glucose, Bld: 83 mg/dL (ref 70–99)
Potassium: 4.2 mEq/L (ref 3.5–5.1)
Sodium: 136 mEq/L (ref 135–145)

## 2021-09-05 LAB — POCT RAPID STREP A (OFFICE): Rapid Strep A Screen: NEGATIVE

## 2021-09-05 MED ORDER — LOSARTAN POTASSIUM 25 MG PO TABS
50.0000 mg | ORAL_TABLET | Freq: Every day | ORAL | 0 refills | Status: DC
Start: 2021-09-05 — End: 2021-12-11

## 2021-09-05 NOTE — Patient Instructions (Signed)
Nice to see you today ?You can start taking the losartan '25mg'$  tablets 2 at the same time during the day  ?I will be in touch with the lab results ?Follow up with me in 3 months for blood pressure recheck ?

## 2021-09-05 NOTE — Assessment & Plan Note (Signed)
Patient likely has upper respiratory infection.  Did encourage patient to COVID test at home again strep test in office was negative.  Continue to monitor symptoms no improvement follow-up ?

## 2021-09-05 NOTE — Progress Notes (Signed)
? ?Established Patient Office Visit ? ?Subjective   ?Patient ID: Dorothy Gay, female    DOB: [birth year]  Age: 56 y.o. MRN: [id number] ? ?Chief Complaint  ?Patient presents with  ? Hypertension  ?  Follow up  ? ? ? ? ?HTN: has been on losartan and taking medicaiton as prescribed. Not able to check blood pressure at home.  She denies headache she cannot get to go away, visual changes, lightheadedness/dizziness, chest pain, shortness of breath. ? ? ?Cold: ?Symptoms started on staurday. ?Works with public. No true sick contact ?Covid test was Saturday at work was negative ?No covid vaccine ?Dayquill and nyquill, tylenol cold and sinus. Helps a little bit ? ? ? ?Review of Systems  ?Constitutional:  Positive for fever and malaise/fatigue. Negative for chills.  ?HENT:  Positive for sore throat (at the beginning has improved) and tinnitus (left ear). Negative for congestion, ear discharge, ear pain and sinus pain.   ?Respiratory:  Positive for cough (non productive) and wheezing. Negative for shortness of breath.   ?Gastrointestinal:  Negative for abdominal pain, nausea and vomiting.  ?Musculoskeletal:  Positive for myalgias. Negative for joint pain.  ?Neurological:  Positive for headaches.  ? ?  ?Objective:  ?  ? ?BP (!) 152/80   Pulse 64   Temp (!) 97.3 ?F (36.3 ?C)   Resp 12   Ht '5\' 7"'$  (1.702 m)   Wt 161 lb (73 kg)   SpO2 97%   BMI 25.22 kg/m?  ? ? ?Physical Exam ?Vitals and nursing note reviewed.  ?Constitutional:   ?   Appearance: Normal appearance.  ?HENT:  ?   Right Ear: Tympanic membrane, ear canal and external ear normal.  ?   Left Ear: Tympanic membrane, ear canal and external ear normal.  ?   Mouth/Throat:  ?   Mouth: Mucous membranes are moist.  ?   Pharynx: Posterior oropharyngeal erythema present.  ?Cardiovascular:  ?   Rate and Rhythm: Normal rate and regular rhythm.  ?   Heart sounds: Normal heart sounds.  ?Pulmonary:  ?   Effort: Pulmonary effort is normal.  ?   Breath sounds: Normal breath sounds.   ?Abdominal:  ?   General: Bowel sounds are normal. There is no distension.  ?   Palpations: There is no mass.  ?   Tenderness: There is no abdominal tenderness.  ?   Hernia: No hernia is present.  ?Lymphadenopathy:  ?   Cervical: Cervical adenopathy present.  ?Skin: ?   General: Skin is warm.  ?Neurological:  ?   Mental Status: She is alert.  ? ? ? ?Results for orders placed or performed in visit on 09/05/21  ?Rapid Strep A  ?Result Value Ref Range  ? Rapid Strep A Screen Negative Negative  ? ? ? ? ?The ASCVD Risk score (Arnett DK, et al., 2019) failed to calculate for the following reasons: ?  The patient has a prior MI or stroke diagnosis ? ?  ?Assessment & Plan:  ? ?Problem List Items Addressed This Visit   ? ?  ? Cardiovascular and Mediastinum  ? HTN (hypertension) - Primary  ?  Restarted patient on losartan 25 mg daily.  Blood pressure still above goal today.  We will titrate patient from losartan 25 mg to losartan 50 mg daily.  Patient can take 2 tablets of the 25 mg until she runs out and then we will switch her to 50 mg tablets thereafter.  Patient does not check blood pressure  at home.  Pending BM P in office to make sure potassium and kidneys maintained well ? ?  ?  ? Relevant Medications  ? losartan (COZAAR) 25 MG tablet  ? Other Relevant Orders  ? Basic Metabolic Panel  ?  ? Respiratory  ? Upper respiratory tract infection  ?  Patient likely has upper respiratory infection.  Did encourage patient to COVID test at home again strep test in office was negative.  Continue to monitor symptoms no improvement follow-up ? ?  ?  ?  ? Other  ? Fever and chills  ?  Use over-the-counter analgesics as needed negative COVID and strep test. ? ?  ?  ? Relevant Orders  ? Rapid Strep A (Completed)  ? Myalgia  ? Rhinorrhea  ? Sore throat  ?  Strep test negative in office. ? ?  ?  ? Relevant Orders  ? Rapid Strep A (Completed)  ? ? ?Return in about 3 months (around 12/06/2021) for HTN recheck.  ? ? ?Romilda Garret, NP ? ?

## 2021-09-05 NOTE — Assessment & Plan Note (Signed)
Strep test negative in office. ?

## 2021-09-05 NOTE — Assessment & Plan Note (Addendum)
Use over-the-counter analgesics as needed negative COVID and strep test. ?

## 2021-09-05 NOTE — Assessment & Plan Note (Signed)
Restarted patient on losartan 25 mg daily.  Blood pressure still above goal today.  We will titrate patient from losartan 25 mg to losartan 50 mg daily.  Patient can take 2 tablets of the 25 mg until she runs out and then we will switch her to 50 mg tablets thereafter.  Patient does not check blood pressure at home.  Pending BM P in office to make sure potassium and kidneys maintained well ?

## 2021-11-29 ENCOUNTER — Other Ambulatory Visit: Payer: Self-pay | Admitting: Nurse Practitioner

## 2021-11-29 DIAGNOSIS — E039 Hypothyroidism, unspecified: Secondary | ICD-10-CM

## 2021-12-11 ENCOUNTER — Ambulatory Visit (INDEPENDENT_AMBULATORY_CARE_PROVIDER_SITE_OTHER)
Admission: RE | Admit: 2021-12-11 | Discharge: 2021-12-11 | Disposition: A | Discharge status: Home / Self Care | Payer: BC Managed Care – PPO | Source: Ambulatory Visit | Attending: Nurse Practitioner | Admitting: Nurse Practitioner

## 2021-12-11 ENCOUNTER — Ambulatory Visit: Payer: BC Managed Care – PPO | Admitting: Nurse Practitioner

## 2021-12-11 ENCOUNTER — Other Ambulatory Visit: Payer: Self-pay | Admitting: Nurse Practitioner

## 2021-12-11 VITALS — BP 152/96 | HR 72 | Temp 96.6°F | Resp 10 | Ht 67.0 in | Wt 161.0 lb

## 2021-12-11 DIAGNOSIS — M25561 Pain in right knee: Secondary | ICD-10-CM

## 2021-12-11 DIAGNOSIS — I10 Essential (primary) hypertension: Secondary | ICD-10-CM

## 2021-12-11 MED ORDER — LOSARTAN POTASSIUM 100 MG PO TABS: 100.0000 mg | ORAL_TABLET | Freq: Every day | ORAL | 1 refills | Status: AC

## 2021-12-11 NOTE — Assessment & Plan Note (Signed)
Patient has longstanding right knee problems but over the past 2 weeks her knee pain on the medial superior side.  Will obtain x-ray of knee.  She has never had injections in the knee before and does not want them.  Did inform our sports medicine physician here if needed.  She does have a brace at home and she needs to use it.  She is also been icing.  Continue over-the-counter supportive measures currently.  Pending x-ray results

## 2021-12-11 NOTE — Assessment & Plan Note (Signed)
Patient was currently maintained on losartan 50 mg and hydrochlorothiazide 25 mg patient is still above goal even upon recheck before leaving office.  Will titrate up to 100 mg of losartan new prescription sent in today.  Patient is changed when she is taking her medicine she has worked third shift and has been taking the blood pressure pill prior to going to sleep in the morning and will take her thyroid medication when she gets up in the evening for her day to work.  If she has any adverse drug events she will let me know we can recheck BMP at next office visit.

## 2021-12-11 NOTE — Patient Instructions (Signed)
Nice to see you today I will be in touch with the xrays once I have them Follow up with me in 3 months for a BP recheck

## 2021-12-11 NOTE — Progress Notes (Signed)
Established Patient Office Visit  Subjective   Patient ID: Dorothy Gay, female    DOB: 04/07/1966  Age: 56 y.o. MRN: [id number]  Chief Complaint  Patient presents with   Hypertension    Follow up, not checking b/p at home   Knee Pain    Right knee pain, x 2 weeks, always has pain in that knee but the new sensation is feeling like someone placing a hot needle to her knee cap. Feels hot to the touch with swelling, swelling present every night that she works-her shift is 3rd.     Hypertension: States that she did run out of medicaotion but has some left over 100 milligram losartan pills that she is halfing. No BP cuff at home.  States she has had no symptoms in regards To hypertension.  Did mention her last primary care provider said that her blood pressure was resistant to medication.  She has been on 100 mg losartan in the past that did cause some lightheadedness/dizziness.  Knee Pain: Right knee has been bothering her for the past 2 weeks. Has chronic pain. No injury. States feels like someone is taking a hot needle into the knee. Nothing helps with the knee pain . States that she tookd advil dual action . Has a knee sleeve but has not been wearing. Ice that helps with the swelling.  Has not had injections into the knee.       Review of Systems  Constitutional:  Negative for chills and fever.  Eyes:  Negative for blurred vision and double vision.  Respiratory:  Negative for shortness of breath.   Cardiovascular:  Negative for chest pain.  Musculoskeletal:  Positive for joint pain.  Neurological:  Negative for headaches.      Objective:     BP (!) 152/96   Pulse 72   Temp (!) 96.6 F (35.9 C)   Resp 10   Ht '5\' 7"'$  (1.702 m)   Wt 161 lb (73 kg)   SpO2 95%   BMI 25.22 kg/m    Physical Exam Vitals and nursing note reviewed.  Constitutional:      Appearance: Normal appearance.  Cardiovascular:     Rate and Rhythm: Normal rate and regular rhythm.     Heart sounds:  Normal heart sounds.  Pulmonary:     Breath sounds: Normal breath sounds.  Abdominal:     General: Bowel sounds are normal.  Musculoskeletal:        General: Tenderness present. No signs of injury.     Right knee: Swelling and effusion present. No ecchymosis. Tenderness present over the medial joint line and lateral joint line.       Legs:  Neurological:     Mental Status: She is alert.      No results found for any visits on 12/11/21.    The ASCVD Risk score (Arnett DK, et al., 2019) failed to calculate for the following reasons:   The patient has a prior MI or stroke diagnosis    Assessment & Plan:   Problem List Items Addressed This Visit       Cardiovascular and Mediastinum   HTN (hypertension) - Primary    Patient was currently maintained on losartan 50 mg and hydrochlorothiazide 25 mg patient is still above goal even upon recheck before leaving office.  Will titrate up to 100 mg of losartan new prescription sent in today.  Patient is changed when she is taking her medicine she has worked third shift  and has been taking the blood pressure pill prior to going to sleep in the morning and will take her thyroid medication when she gets up in the evening for her day to work.  If she has any adverse drug events she will let me know we can recheck BMP at next office visit.      Relevant Medications   losartan (COZAAR) 100 MG tablet     Other   Acute pain of right knee    Patient has longstanding right knee problems but over the past 2 weeks her knee pain on the medial superior side.  Will obtain x-ray of knee.  She has never had injections in the knee before and does not want them.  Did inform our sports medicine physician here if needed.  She does have a brace at home and she needs to use it.  She is also been icing.  Continue over-the-counter supportive measures currently.  Pending x-ray results       Return in about 3 months (around 03/13/2022) for BP recheck.Romilda Garret, NP

## 2021-12-16 ENCOUNTER — Encounter: Payer: Self-pay | Admitting: Nurse Practitioner

## 2022-01-27 ENCOUNTER — Emergency Department: Payer: BC Managed Care – PPO

## 2022-01-27 ENCOUNTER — Other Ambulatory Visit: Payer: Self-pay

## 2022-01-27 ENCOUNTER — Encounter: Payer: Self-pay | Admitting: Radiology

## 2022-01-27 ENCOUNTER — Emergency Department
Admission: EM | Admit: 2022-01-27 | Discharge: 2022-01-27 | Disposition: A | Discharge status: Home / Self Care | Payer: BC Managed Care – PPO | Attending: Emergency Medicine | Admitting: Emergency Medicine

## 2022-01-27 DIAGNOSIS — W228XXA Striking against or struck by other objects, initial encounter: Secondary | ICD-10-CM | POA: Insufficient documentation

## 2022-01-27 DIAGNOSIS — I10 Essential (primary) hypertension: Secondary | ICD-10-CM | POA: Insufficient documentation

## 2022-01-27 DIAGNOSIS — Y990 Civilian activity done for income or pay: Secondary | ICD-10-CM | POA: Insufficient documentation

## 2022-01-27 DIAGNOSIS — M1990 Unspecified osteoarthritis, unspecified site: Secondary | ICD-10-CM | POA: Diagnosis not present

## 2022-01-27 DIAGNOSIS — S6992XA Unspecified injury of left wrist, hand and finger(s), initial encounter: Secondary | ICD-10-CM | POA: Diagnosis not present

## 2022-01-27 DIAGNOSIS — S60222A Contusion of left hand, initial encounter: Secondary | ICD-10-CM

## 2022-01-27 DIAGNOSIS — M79645 Pain in left finger(s): Secondary | ICD-10-CM | POA: Diagnosis not present

## 2022-01-27 NOTE — ED Notes (Signed)
DC instructions given verbally and in writing, understanding voiced, signature obtained. Pt left in stable condition ambulatory to POV.

## 2022-01-27 NOTE — Discharge Instructions (Signed)
Your x-ray today is negative for fracture.  You have likely exacerbated arthritis in the thumb or experienced bruising.  Please use Tylenol or ibuprofen as needed for discomfort as written on the box.  Return to the emergency department for any symptom personally concerning to yourself.

## 2022-01-27 NOTE — ED Notes (Signed)
Noted pt in room #11.  This RN assumed care.

## 2022-01-27 NOTE — ED Triage Notes (Signed)
Pt states she went to open a door at work, injuring left thumb. Pt states she was not provided with paperwork for drug screen from employer.

## 2022-01-27 NOTE — ED Provider Notes (Signed)
   [facility] Provider Note    Event Date/Time   First MD Initiated Contact with Patient 01/27/22 [phone]     (approximate)  History   Chief Complaint: Hand Injury  HPI  Dorothy Gay is a 56 y.o. female with a past medical history of anemia, hypertension, presents to the emergency department for left hand pain.  According to the patient she tried to open a door yesterday and she pushed on the metal bar to open the door, hit her left hand on this metal bar.  States ever since she has been experiencing pain at the base of the thumb.  Patient states she has trouble using the hand at work due to the pain so she came to the emergency department for evaluation.  Denies any other injuries or concerns.  Physical Exam   Triage Vital Signs: ED Triage Vitals  Enc Vitals Group     BP 01/27/22 0418 (!) 166/84     Pulse Rate 01/27/22 0418 70     Resp 01/27/22 0418 16     Temp 01/27/22 0418 98.4 F (36.9 C)     Temp src --      SpO2 01/27/22 0418 96 %     Weight 01/27/22 0415 160 lb (72.6 kg)     Height 01/27/22 0415 '5\' 7"'$  (1.702 m)     Head Circumference --      Peak Flow --      Pain Score 01/27/22 0415 6     Pain Loc --      Pain Edu? --      Excl. in [place]? --     Most recent vital signs: Vitals:   01/27/22 0418  BP: (!) 166/84  Pulse: 70  Resp: 16  Temp: 98.4 F (36.9 C)  SpO2: 96%    General: Awake, no distress.  CV:  Good peripheral perfusion.  Regular rate and rhythm  Resp:  Normal effort.  Equal breath sounds bilaterally.  Abd:  No distention.  Soft, nontender.  No rebound or guarding. Other:  Moderate tenderness to palpation at the base of the left thumb.  Neurovascular intact with good cap refill.   ED Results / Procedures / Treatments    RADIOLOGY  I have reviewed and interpreted the hand x-ray images I do not see any obvious fracture on my evaluation. 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   MEDICATIONS ORDERED IN ED: Medications - No data to display   IMPRESSION / MDM / [place] / ED COURSE  I reviewed the triage vital signs and the nursing notes.  Patient's presentation is most consistent with acute, uncomplicated illness.  Patient presents emergency department for pain to her left thumb ever since trying to push open a door yesterday.  X-rays negative for fracture does show chronic arthritis to this joint.  Suspect possible arthritis exacerbation versus bruising.  Discussed Tylenol ibuprofen as needed for discomfort.  We will provide orthopedic referral for the patient to follow-up if she does not feel better over the next few days.  Patient agreeable to plan.  FINAL CLINICAL IMPRESSION(S) / ED DIAGNOSES   Left thumb pain    Note:  This document was prepared using Dragon voice recognition software and may include unintentional dictation errors.   Harvest Dark, MD 01/27/22 [phone]

## 2022-02-13 ENCOUNTER — Ambulatory Visit: Payer: BC Managed Care – PPO | Admitting: Nurse Practitioner

## 2022-02-13 ENCOUNTER — Encounter: Payer: Self-pay | Admitting: Nurse Practitioner

## 2022-02-13 VITALS — BP 166/94 | HR 72 | Temp 96.9°F | Resp 14 | Ht 67.0 in | Wt 162.5 lb

## 2022-02-13 DIAGNOSIS — I10 Essential (primary) hypertension: Secondary | ICD-10-CM | POA: Diagnosis not present

## 2022-02-13 DIAGNOSIS — M79645 Pain in left finger(s): Secondary | ICD-10-CM

## 2022-02-13 DIAGNOSIS — M79672 Pain in left foot: Secondary | ICD-10-CM

## 2022-02-13 NOTE — Assessment & Plan Note (Signed)
Secondary injury to her hand giving out.  Did offer to do an x-ray of the foot patient politely declined.  If it does not continue to improve she will reach out with get imaging at that juncture

## 2022-02-13 NOTE — Progress Notes (Signed)
Acute Office Visit  Subjective:     Patient ID: Dorothy Gay, female    DOB: 1965/10/07, 56 y.o.   MRN: [id number]  Chief Complaint  Patient presents with   Hand Injury    On 01/27/22 went to ER for left hand pain with certain movements pain is worse.      Patient is in today for Hand pain   Patient was seen in the ED on 01/27/2022 and an xray of the hand was done. It did not show any acute fracture just some arthritis. She is here today for follow up of worsening symptoms  States that there is a back door at work that has a metal bar that is needed to open the door. She went to push and the door did not open. States that the pain happened and then worse the next day. States that she waited 24 hours to be seen States that since then it is not   States that she went pick up a cigarette butt receptacles and dropped it striking her left foot. Hurts  States that wrapping in an ace bandage helps the hand Ibuprofen that does help minimally   Review of Systems  Constitutional:  Negative for chills and fever.  Musculoskeletal:  Positive for joint pain.  Neurological:  Positive for weakness. Negative for tingling.        Objective:    BP (!) 166/94   Pulse 72   Temp (!) 96.9 F (36.1 C) (Temporal)   Resp 14   Ht '5\' 7"'$  (1.702 m)   Wt 162 lb 8 oz (73.7 kg)   SpO2 96%   BMI 25.45 kg/m    Physical Exam Vitals and nursing note reviewed.  Constitutional:      Appearance: Normal appearance.  Cardiovascular:     Rate and Rhythm: Normal rate and regular rhythm.     Pulses:          Radial pulses are 2+ on the right side and 2+ on the left side.     Heart sounds: Murmur heard.  Pulmonary:     Effort: Pulmonary effort is normal.     Breath sounds: Normal breath sounds.  Musculoskeletal:        General: Tenderness and signs of injury present.       Arms:       Legs:     Comments: Ecchymosis and tenderness to palpation along first second and third metatarsal   Neurological:     Mental Status: She is alert.     No results found for any visits on 02/13/22.      Assessment & Plan:   Problem List Items Addressed This Visit       Cardiovascular and Mediastinum   HTN (hypertension)    Blood pressure elevated in office today blood pressure was elevated at emergency department.  Patient does have a follow-up with me coming up she is encouraged to keep that so we can reevaluate her blood pressure.        Other   Left foot pain    Secondary injury to her hand giving out.  Did offer to do an x-ray of the foot patient politely declined.  If it does not continue to improve she will reach out with get imaging at that juncture      Pain of left thumb - Primary    Status post injury at work.  Patient was evaluated emergency department imaging obtained that showed no acute fracture.  Patient states is getting worse and not better.  We will have patient wear a thumb spica splint while at work and take ibuprofen as needed.  Ambulatory referral made to orthopedist.      Relevant Orders   Ambulatory referral to Orthopedic Surgery    No orders of the defined types were placed in this encounter.   Return if symptoms worsen or fail to improve, for As scheduled with me in November.Romilda Garret, NP

## 2022-02-13 NOTE — Patient Instructions (Signed)
Nice to see you today You can use a thumb spica splint and wear it while you are working I am going to refer you to the orthopedist for follow up If the foot does not continue to get better let me know and we can get an xray  You can get Claritin, xyzal, or Allegra over the counter to see if that helps with the allergies and sneezing

## 2022-02-13 NOTE — Assessment & Plan Note (Signed)
Blood pressure elevated in office today blood pressure was elevated at emergency department.  Patient does have a follow-up with me coming up she is encouraged to keep that so we can reevaluate her blood pressure.

## 2022-02-13 NOTE — Assessment & Plan Note (Signed)
Status post injury at work.  Patient was evaluated emergency department imaging obtained that showed no acute fracture.  Patient states is getting worse and not better.  We will have patient wear a thumb spica splint while at work and take ibuprofen as needed.  Ambulatory referral made to orthopedist.

## 2022-03-13 ENCOUNTER — Ambulatory Visit: Payer: BC Managed Care – PPO | Admitting: Nurse Practitioner

## 2022-07-14 ENCOUNTER — Telehealth: Payer: Self-pay | Admitting: Nurse Practitioner

## 2022-07-14 ENCOUNTER — Telehealth: Payer: Self-pay

## 2022-07-14 DIAGNOSIS — E039 Hypothyroidism, unspecified: Secondary | ICD-10-CM

## 2022-07-14 MED ORDER — EUTHYROX 75 MCG PO TABS: 75.0000 ug | ORAL_TABLET | Freq: Every day | ORAL | 0 refills | Status: DC

## 2022-07-14 NOTE — Telephone Encounter (Signed)
Rx sent to provider

## 2022-07-14 NOTE — Telephone Encounter (Signed)
Prescription Request  07/14/2022  LOV: 02/13/2022  What is the name of the medication or equipment? levothyroxine (SYNTHROID, LEVOTHROID) 25 MCG tablet [id number]   Have you contacted your pharmacy to request a refill? No   Which pharmacy would you like this sent to?    [facility] [address] [place] ([place]) [place] [address] Phone: [phone] Fax: [phone]    Patient notified that their request is being sent to the clinical staff for review and that they should receive a response within 2 business days.   Please advise at Mobile [phone] (mobile)

## 2022-07-14 NOTE — Addendum Note (Signed)
Addended by: Michela Pitcher on: 07/14/2022 04:46 PM   Modules accepted: Orders

## 2022-07-14 NOTE — Telephone Encounter (Signed)
Left message to return call to our office. PT needs a f/u appointment with [facility].

## 2022-07-14 NOTE — Telephone Encounter (Signed)
Levothyroxine (SYNTHROID, LEVOTHROID) 25 MCG tablet Last visit 02/13/2022 No follow up

## 2022-07-14 NOTE — Telephone Encounter (Signed)
Refill sent in patient needs follow up in office

## 2022-07-15 NOTE — Telephone Encounter (Signed)
Pt return call .advise her to make an appointment. With PCP . Pt stated she just started a new job will call back to schedule

## 2022-08-08 ENCOUNTER — Other Ambulatory Visit: Payer: Self-pay | Admitting: Nurse Practitioner

## 2022-08-08 DIAGNOSIS — E039 Hypothyroidism, unspecified: Secondary | ICD-10-CM

## 2022-09-20 ENCOUNTER — Other Ambulatory Visit: Payer: Self-pay

## 2022-09-20 ENCOUNTER — Emergency Department: Payer: Self-pay

## 2022-09-20 ENCOUNTER — Emergency Department
Admission: EM | Admit: 2022-09-20 | Discharge: 2022-09-21 | Disposition: A | Discharge status: Home / Self Care | Payer: Self-pay | Attending: Emergency Medicine | Admitting: Emergency Medicine

## 2022-09-20 DIAGNOSIS — Z7982 Long term (current) use of aspirin: Secondary | ICD-10-CM | POA: Insufficient documentation

## 2022-09-20 DIAGNOSIS — R051 Acute cough: Secondary | ICD-10-CM

## 2022-09-20 DIAGNOSIS — I10 Essential (primary) hypertension: Secondary | ICD-10-CM | POA: Insufficient documentation

## 2022-09-20 DIAGNOSIS — E039 Hypothyroidism, unspecified: Secondary | ICD-10-CM | POA: Insufficient documentation

## 2022-09-20 DIAGNOSIS — Z79899 Other long term (current) drug therapy: Secondary | ICD-10-CM | POA: Insufficient documentation

## 2022-09-20 DIAGNOSIS — Z7951 Long term (current) use of inhaled steroids: Secondary | ICD-10-CM | POA: Insufficient documentation

## 2022-09-20 DIAGNOSIS — J441 Chronic obstructive pulmonary disease with (acute) exacerbation: Secondary | ICD-10-CM | POA: Insufficient documentation

## 2022-09-20 DIAGNOSIS — Z859 Personal history of malignant neoplasm, unspecified: Secondary | ICD-10-CM | POA: Insufficient documentation

## 2022-09-20 DIAGNOSIS — Z20822 Contact with and (suspected) exposure to covid-19: Secondary | ICD-10-CM | POA: Insufficient documentation

## 2022-09-20 LAB — CBC WITH DIFFERENTIAL/PLATELET
Abs Immature Granulocytes: 0.01 10*3/uL (ref 0.00–0.07)
Basophils Absolute: 0 10*3/uL (ref 0.0–0.1)
Basophils Relative: 1 %
Eosinophils Absolute: 0.2 10*3/uL (ref 0.0–0.5)
Eosinophils Relative: 4 %
HCT: 43 % (ref 36.0–46.0)
Hemoglobin: 13.7 g/dL (ref 12.0–15.0)
Immature Granulocytes: 0 %
Lymphocytes Relative: 30 %
Lymphs Abs: 1.7 10*3/uL (ref 0.7–4.0)
MCH: 27.7 pg (ref 26.0–34.0)
MCHC: 31.9 g/dL (ref 30.0–36.0)
MCV: 86.9 fL (ref 80.0–100.0)
Monocytes Absolute: 0.7 10*3/uL (ref 0.1–1.0)
Monocytes Relative: 13 %
Neutro Abs: 2.9 10*3/uL (ref 1.7–7.7)
Neutrophils Relative %: 52 %
Platelets: 223 10*3/uL (ref 150–400)
RBC: 4.95 MIL/uL (ref 3.87–5.11)
RDW: 15.1 % (ref 11.5–15.5)
WBC: 5.5 10*3/uL (ref 4.0–10.5)
nRBC: 0 % (ref 0.0–0.2)

## 2022-09-20 LAB — BASIC METABOLIC PANEL
Anion gap: 8 (ref 5–15)
BUN: 6 mg/dL (ref 6–20)
CO2: 25 mmol/L (ref 22–32)
Calcium: 8.4 mg/dL — ABNORMAL LOW (ref 8.9–10.3)
Chloride: 104 mmol/L (ref 98–111)
Creatinine, Ser: 0.74 mg/dL (ref 0.44–1.00)
GFR, Estimated: >60 mL/min (ref >60)
Glucose, Bld: 97 mg/dL (ref 70–99)
Potassium: 3.4 mmol/L — ABNORMAL LOW (ref 3.5–5.1)
Sodium: 137 mmol/L (ref 135–145)

## 2022-09-20 LAB — SARS CORONAVIRUS 2 BY RT PCR: SARS Coronavirus 2 by RT PCR: NEGATIVE

## 2022-09-20 NOTE — ED Triage Notes (Signed)
2 day nonproductive cough. Says she has a history of chronic bronchitis and is coughing to the point she is unable to catch her breath.   Was using albuterol inhaler at one point but does not have it anymore.

## 2022-09-21 MED ORDER — ALBUTEROL SULFATE (2.5 MG/3ML) 0.083% IN NEBU: 2.5000 mg | INHALATION_SOLUTION | RESPIRATORY_TRACT | 0 refills | Status: DC | PRN

## 2022-09-21 MED ORDER — IPRATROPIUM-ALBUTEROL 0.5-2.5 (3) MG/3ML IN SOLN
3.0000 mL | Freq: Once | RESPIRATORY_TRACT | Status: AC
  Administered 2022-09-21: 3 mL via RESPIRATORY_TRACT
  Filled 2022-09-21: qty 3

## 2022-09-21 MED ORDER — METHYLPREDNISOLONE SODIUM SUCC 125 MG IJ SOLR
125.0000 mg | Freq: Once | INTRAMUSCULAR | Status: AC
Start: 2022-09-21 — End: 2022-09-21
  Administered 2022-09-21: 125 mg via INTRAMUSCULAR
  Filled 2022-09-21: qty 2

## 2022-09-21 MED ORDER — PREDNISONE 20 MG PO TABS: ORAL_TABLET | ORAL | 0 refills | Status: DC

## 2022-09-21 MED ORDER — BENZONATATE 100 MG PO CAPS
200.0000 mg | ORAL_CAPSULE | Freq: Once | ORAL | Status: AC
  Administered 2022-09-21: 200 mg via ORAL
  Filled 2022-09-21: qty 2

## 2022-09-21 MED ORDER — COMPRESSOR/NEBULIZER MISC: 1.0000 [IU] | 0 refills | Status: AC | PRN

## 2022-09-21 MED ORDER — HYDROCOD POLI-CHLORPHE POLI ER 10-8 MG/5ML PO SUER: 5.0000 mL | Freq: Two times a day (BID) | ORAL | 0 refills | Status: DC | PRN

## 2022-09-21 NOTE — Discharge Instructions (Addendum)
1.  Finish prednisone 60 mg daily x 4 days. 2.  You may use Tussionex twice daily as needed for cough. 3.  Use Albuterol nebulizer every 4 hours as needed for breathing difficulty. 4.  Return to the ER for worsening symptoms, persistent vomiting, difficulty breathing or other concerns.

## 2022-09-21 NOTE — ED Provider Notes (Signed)
[facility] Provider Note    Event Date/Time   First MD Initiated Contact with Patient 09/21/22 0011     (approximate)   History   Cough   HPI  Dorothy Gay is a 57 y.o. female who presents to the ED from home with a chief complaint of nonproductive cough x 2 days.  History of COPD not on home oxygen.  States she has been taking Tussionex without relief of symptoms.  Using inhaler without relief of symptoms.  Denies fever/chills, chest pain, abdominal pain, nausea, vomiting or dizziness.     Past Medical History   Past Medical History:  Diagnosis Date   Anemia    Arthritis    Cancer (HCC)    Chronic bronchitis (HCC)    Headache    migraines   Heart murmur    Hypertension    Myocardial infarction (HCC) 09/2016   MILD PER PT-WHEN READING THRU HOSPITAL H&P IT LOOKS AS THOUGH PT DID NOT HAVE MI-TROPONINS WERE ELEVATED BUT STRESS WAS NEGATIVE     Active Problem List   Patient Active Problem List   Diagnosis Date Noted   Left foot pain 02/13/2022   Pain of left thumb 02/13/2022   Acute pain of right knee 12/11/2021   Upper respiratory tract infection 09/05/2021   Fever and chills 09/05/2021   Myalgia 09/05/2021   Rhinorrhea 09/05/2021   Sore throat 09/05/2021   Eustachian tube disorder, right 07/25/2021   Family history of diabetes mellitus 07/25/2021   History of anemia 07/25/2021   Osteoarthritis 04/05/2020   COPD (chronic obstructive pulmonary disease) (HCC) 04/05/2020   Migraines 04/05/2020   HTN (hypertension) 04/05/2020   HLD (hyperlipidemia) 04/05/2020   MI (myocardial infarction) (HCC) 04/05/2020   GERD (gastroesophageal reflux disease) 04/05/2020   Hypothyroidism 04/05/2020     Past Surgical History   Past Surgical History:  Procedure Laterality Date   CERVICAL CONIZATION W/BX N/A 10/30/2017   Procedure: CONIZATION CERVIX WITH BIOPSY;  Surgeon: Christeen Douglas, MD;  Location: [facility] ORS;  Service: Gynecology;  Laterality:  N/A;   CESAREAN SECTION  1988    HYSTEROSCOPY WITH D & C N/A 10/30/2017   Procedure: DILATATION AND CURETTAGE /HYSTEROSCOPY;  Surgeon: Christeen Douglas, MD;  Location: [facility] ORS;  Service: Gynecology;  Laterality: N/A;   LEEP  1997     Home Medications   Prior to Admission medications   Medication Sig Start Date End Date Taking? Authorizing Provider  albuterol (PROVENTIL) (2.5 MG/3ML) 0.083% nebulizer solution Take 3 mLs (2.5 mg total) by nebulization every 4 (four) hours as needed for wheezing or shortness of breath. 09/21/22  Yes Irean Hong, MD  chlorpheniramine-HYDROcodone (TUSSIONEX) 10-8 MG/5ML Take 5 mLs by mouth every 12 (twelve) hours as needed for cough. 09/21/22  Yes Irean Hong, MD  Nebulizers (COMPRESSOR/NEBULIZER) MISC 1 Units by Does not apply route every 4 (four) hours as needed. 09/21/22  Yes Irean Hong, MD  predniSONE (DELTASONE) 20 MG tablet 3 tablets daily x 4 days 09/21/22  Yes Irean Hong, MD  albuterol (VENTOLIN HFA) 108 (90 Base) MCG/ACT inhaler Inhale 2 puffs into the lungs every 6 (six) hours as needed for wheezing or shortness of breath. 07/25/21   Eden Emms, NP  Aspirin-Acetaminophen-Caffeine (GOODYS EXTRA STRENGTH) [phone] MG PACK Take by mouth.    [provider]  EUTHYROX 75 MCG tablet Take 1 tablet (75 mcg total) by mouth daily. NEED OFFICE VISIT FOR FURTHER REFILLS 08/08/22   Toney Reil,  Genene Churn, NP  hydrochlorothiazide (HYDRODIURIL) 25 MG tablet Take 1 tablet (25 mg total) by mouth daily. 07/25/21   Eden Emms, NP  ibuprofen (ADVIL) 200 MG tablet Take 200 mg by mouth every 6 (six) hours as needed.    [provider]  losartan (COZAAR) 100 MG tablet Take 1 tablet (100 mg total) by mouth daily. 12/11/21   Eden Emms, NP     Allergies  Shrimp extract, Amlodipine, Ferrous sulfate, Fish allergy, Haemophilus influenzae vaccines, Lisinopril, Shellfish allergy, Sumatriptan, and Mango flavor   Family History   Family History  Problem  Relation Age of Onset   Hypertension Mother    Atrial fibrillation Mother    Stroke Mother    Thyroid disease Mother    Gout Mother    Heart attack Mother    Drug abuse Brother    Diabetes Daughter 44       type 2   Heart disease Maternal Grandmother    Stroke Maternal Grandmother    Diabetes Maternal Grandfather      Physical Exam  Triage Vital Signs: ED Triage Vitals [09/20/22 2200]  Enc Vitals Group     BP (!) 178/108     Pulse Rate 95     Resp (!) 24     Temp 98.6 F (37 C)     Temp src      SpO2 95 %     Weight 170 lb (77.1 kg)     Height 5\' 7"  (1.702 m)     Head Circumference      Peak Flow      Pain Score 0     Pain Loc      Pain Edu?      Excl. in [place]?     Updated Vital Signs: BP (!) 178/108   Pulse 95   Temp 98.6 F (37 C)   Resp (!) 24   Ht 5\' 7"  (1.702 m)   Wt 77.1 kg   SpO2 95%   BMI 26.63 kg/m    General: Awake, mild distress.  CV:  RRR.  Good peripheral perfusion.  Resp:  Increased effort.  Diminished aeration bilaterally, otherwise CTAB.  Active dry cough noted. Abd:  Nontender.  No distention.  Other:  Bilateral calves are supple and nontender.   ED Results / Procedures / Treatments  Labs (all labs ordered are listed, but only abnormal results are displayed) Labs Reviewed  BASIC METABOLIC PANEL - Abnormal; Notable for the following components:      Result Value   Potassium 3.4 (*)    Calcium 8.4 (*)    All other components within normal limits  SARS CORONAVIRUS 2 BY RT PCR  CBC WITH DIFFERENTIAL/PLATELET     EKG  None   RADIOLOGY I have independently visualized and interpreted patient's x-ray as well as noted the radiology interpretation:  Chest x-ray: No acute cardiopulmonary process  Official radiology report(s): DG Chest 2 View  Result Date: 09/20/2022 CLINICAL DATA:  Cough and shortness of breath EXAM: CHEST - 2 VIEW COMPARISON:  05/20/2018 FINDINGS: No change from 05/20/2018. Normal cardiomediastinal silhouette.  Chronic scarring right mid lung. Chronic bronchitic changes. Trace right pleural effusion or pleural thickening. No focal consolidation pneumothorax. Elevated right hemidiaphragm. IMPRESSION: No active cardiopulmonary disease. Electronically Signed   By: Minerva Fester M.D.   On: 09/20/2022 22:34     PROCEDURES:  Critical Care performed: No  Procedures   MEDICATIONS ORDERED IN ED: Medications  ipratropium-albuterol (DUONEB) 0.5-2.5 (  3) MG/3ML nebulizer solution 3 mL (3 mLs Nebulization Given 09/21/22 0033)  benzonatate (TESSALON) capsule 200 mg (200 mg Oral Given 09/21/22 0030)  methylPREDNISolone sodium succinate (SOLU-MEDROL) 125 mg/2 mL injection 125 mg (125 mg Intramuscular Given 09/21/22 0031)     IMPRESSION / MDM / ASSESSMENT AND PLAN / ED COURSE  I reviewed the triage vital signs and the nursing notes.                             57 year old female presenting with cough and shortness of breath. Differential includes, but is not limited to, viral syndrome, bronchitis including COPD exacerbation, pneumonia, reactive airway disease including asthma, CHF including exacerbation with or without pulmonary/interstitial edema, pneumothorax, ACS, thoracic trauma, and pulmonary embolism.  I personally reviewed patient's records and note a PCP office visit in December 2023 for thumb pain.  Patient's presentation is most consistent with acute presentation with potential threat to life or bodily function.  Laboratory results demonstrate normal WBC 5.5, unremarkable electrolytes, negative COVID and chest x-ray is clear.  Will administer DuoNeb, Tessalon, Solu-Medrol and reassess.  Clinical Course as of 09/21/22 1610  Wynelle Link Sep 21, 2022  0132 Aeration improved.  Will discharge home on prednisone burst, Tussionex, nebulizer machine and patient will follow-up with her PCP.  Strict return precautions given.  Patient verbalizes understanding and agrees with plan of care. [JS]    Clinical Course User  Index [JS] Irean Hong, MD     FINAL CLINICAL IMPRESSION(S) / ED DIAGNOSES   Final diagnoses:  Acute cough  COPD with acute exacerbation (HCC)     Rx / DC Orders   ED Discharge Orders          Ordered    predniSONE (DELTASONE) 20 MG tablet        09/21/22 0133    chlorpheniramine-HYDROcodone (TUSSIONEX) 10-8 MG/5ML  Every 12 hours PRN        09/21/22 0133    Nebulizers (COMPRESSOR/NEBULIZER) MISC  Every 4 hours PRN        09/21/22 0133    albuterol (PROVENTIL) (2.5 MG/3ML) 0.083% nebulizer solution  Every 4 hours PRN        09/21/22 0133             Note:  This document was prepared using Dragon voice recognition software and may include unintentional dictation errors.   Irean Hong, MD 09/21/22 [phone]

## 2022-09-22 ENCOUNTER — Telehealth: Payer: Self-pay

## 2022-09-22 NOTE — Transitions of Care (Post Inpatient/ED Visit) (Signed)
   09/22/2022  Name: KINSEY SCULL MRN: [id number] DOB: 01-19-1966  Today's TOC FU Call Status: Today's TOC FU Call Status:: Unsuccessul Call (1st Attempt) Unsuccessful Call (1st Attempt) Date: 09/22/22  Attempted to reach the patient regarding the most recent Inpatient/ED visit.  Follow Up Plan: Additional outreach attempts will be made to reach the patient to complete the Transitions of Care (Post Inpatient/ED visit) call.   Signature Agnes Lawrence, CMA (AAMA)  [facility] [phone]

## 2022-09-24 NOTE — Transitions of Care (Post Inpatient/ED Visit) (Signed)
   09/24/2022  Name: Dorothy Gay MRN: [id number] DOB: Jun 19, 1965  Today's TOC FU Call Status: Today's TOC FU Call Status:: Unsuccessful Call (2nd Attempt) Unsuccessful Call (1st Attempt) Date: 09/22/22 Unsuccessful Call (2nd Attempt) Date: 09/24/22  Attempted to reach the patient regarding the most recent Inpatient/ED visit.  Follow Up Plan: Additional outreach attempts will be made to reach the patient to complete the Transitions of Care (Post Inpatient/ED visit) call.   Signature Agnes Lawrence, CMA (AAMA)  [facility] [phone]

## 2022-09-26 ENCOUNTER — Other Ambulatory Visit: Payer: Self-pay

## 2022-09-26 ENCOUNTER — Emergency Department
Admission: EM | Admit: 2022-09-26 | Discharge: 2022-09-26 | Disposition: A | Discharge status: Home / Self Care | Payer: Self-pay | Attending: Emergency Medicine | Admitting: Emergency Medicine

## 2022-09-26 ENCOUNTER — Emergency Department: Payer: Self-pay

## 2022-09-26 DIAGNOSIS — J189 Pneumonia, unspecified organism: Secondary | ICD-10-CM

## 2022-09-26 DIAGNOSIS — J181 Lobar pneumonia, unspecified organism: Secondary | ICD-10-CM | POA: Insufficient documentation

## 2022-09-26 MED ORDER — AZITHROMYCIN 500 MG PO TABS
500.0000 mg | ORAL_TABLET | Freq: Once | ORAL | Status: AC
  Administered 2022-09-26: 500 mg via ORAL
  Filled 2022-09-26: qty 1

## 2022-09-26 MED ORDER — AZITHROMYCIN 250 MG PO TABS: ORAL_TABLET | ORAL | 0 refills | Status: DC

## 2022-09-26 MED ORDER — BENZONATATE 100 MG PO CAPS: 100.0000 mg | ORAL_CAPSULE | Freq: Three times a day (TID) | ORAL | 0 refills | Status: AC | PRN

## 2022-09-26 MED ORDER — PSEUDOEPH-BROMPHEN-DM 30-2-10 MG/5ML PO SYRP: 10.0000 mL | ORAL_SOLUTION | Freq: Four times a day (QID) | ORAL | 0 refills | Status: DC | PRN

## 2022-09-26 NOTE — ED Triage Notes (Signed)
Pt to ED from home for cough. Pt was seen on 5/18 for same. Pt was given meds. Pt states "I feel like I need antibiotics bc they didn't given them to me last time I was here."  Pt is CAOx4, in no acute distress and ambulatory  in triage.

## 2022-09-26 NOTE — ED Provider Notes (Signed)
[facility] Provider Note  Patient Contact: 8:59 PM (approximate)   History   Cough   HPI  Dorothy Gay is a 57 y.o. female who presents the emergency department for ongoing cough.  Patient was seen here roughly a week ago for similar symptoms.  She states that her cough has persisted and she feels like she has pneumonia.  No difficulty breathing, chest pain, shortness of breath.     Physical Exam   Triage Vital Signs: ED Triage Vitals  Enc Vitals Group     BP 09/26/22 1946 136/85     Pulse Rate 09/26/22 1946 76     Resp 09/26/22 1946 18     Temp 09/26/22 1946 98.5 F (36.9 C)     Temp Source 09/26/22 1946 Oral     SpO2 09/26/22 1946 94 %     Weight 09/26/22 1944 169 lb 12.1 oz (77 kg)     Height 09/26/22 1944 5\' 7"  (1.702 m)     Head Circumference --      Peak Flow --      Pain Score 09/26/22 1944 0     Pain Loc --      Pain Edu? --      Excl. in GC? --     Most recent vital signs: Vitals:   09/26/22 1946  BP: 136/85  Pulse: 76  Resp: 18  Temp: 98.5 F (36.9 C)  SpO2: 94%     General: Alert and in no acute distres  Cardiovascular:  Good peripheral perfusion Respiratory: Normal respiratory effort without tachypnea or retractions. Lungs CTAB. Good air entry to the bases with no decreased or absent breath sounds. Musculoskeletal: Full range of motion to all extremities.  Neurologic:  No gross focal neurologic deficits are appreciated.  Skin:   No rash noted Other:   ED Results / Procedures / Treatments   Labs (all labs ordered are listed, but only abnormal results are displayed) Labs Reviewed - No data to display   EKG     RADIOLOGY  I personally viewed, evaluated, and interpreted these images as part of my medical decision making, as well as reviewing the written report by the radiologist.  ED Provider Interpretation: Findings consistent with right lower lobe opacity concerning for early pneumonia.  DG Chest 2  View  Result Date: 09/26/2022 CLINICAL DATA:  Cough EXAM: CHEST - 2 VIEW COMPARISON:  CXR 09/20/22 FINDINGS: Trace right pleural effusion. No pneumothorax. There is a hazy opacity in the right lung base could represent atelectasis or infection. Normal cardiac and mediastinal contours. No radiographically apparent displaced rib fractures. Visualized upper abdomen is unremarkable. IMPRESSION: Hazy opacity in the right lung base could represent atelectasis or infection. Electronically Signed   By: Lorenza Cambridge M.D.   On: 09/26/2022 20:17    PROCEDURES:  Critical Care performed: No  Procedures   MEDICATIONS ORDERED IN ED: Medications  azithromycin (ZITHROMAX) tablet 500 mg (has no administration in time range)     IMPRESSION / MDM / ASSESSMENT AND PLAN / ED COURSE  I reviewed the triage vital signs and the nursing notes.                                 Differential diagnosis includes, but is not limited to, pneumonia, bronchitis, viral illness   Patient's presentation is most consistent with acute presentation with potential threat to life or bodily function.  Patient's diagnosis is consistent with right lower lobe pneumonia.  Patient presents emergency department with ongoing cough.  She was diagnosed with viral illness roughly a week ago.  Given the length of time, he is opacity on x-ray I feel that patient likely has mild community-acquired pneumonia.  Patient will be started on antibiotics and given cough medications.  Concerning signs and symptoms and return precautions discussed with the patient.  Otherwise follow-up with primary care..  Patient is given ED precautions to return to the ED for any worsening or new symptoms.     FINAL CLINICAL IMPRESSION(S) / ED DIAGNOSES   Final diagnoses:  Community acquired pneumonia of right lower lobe of lung     Rx / DC Orders   ED Discharge Orders          Ordered    azithromycin (ZITHROMAX Z-PAK) 250 MG tablet        09/26/22  2111    benzonatate (TESSALON PERLES) 100 MG capsule  3 times daily PRN        09/26/22 2111    brompheniramine-pseudoephedrine-DM 30-2-10 MG/5ML syrup  4 times daily PRN        09/26/22 2111             Note:  This document was prepared using Dragon voice recognition software and may include unintentional dictation errors.   Lanette Hampshire 09/26/22 2113    Chesley Noon, MD 09/29/22 [phone]

## 2022-09-30 ENCOUNTER — Telehealth: Payer: Self-pay

## 2022-09-30 NOTE — Transitions of Care (Post Inpatient/ED Visit) (Signed)
   09/30/2022  Name: Dorothy Gay MRN: [id number] DOB: [birth year]  Today's TOC FU Call Status: Today's TOC FU Call Status:: Unsuccessul Call (1st Attempt) Unsuccessful Call (1st Attempt) Date: 09/30/22  Attempted to reach the patient regarding the most recent Inpatient/ED visit.  Follow Up Plan: Additional outreach attempts will be made to reach the patient to complete the Transitions of Care (Post Inpatient/ED visit) call.   Signature Donnamarie Poag, CMA

## 2022-09-30 NOTE — Transitions of Care (Post Inpatient/ED Visit) (Cosign Needed)
   09/30/2022  Name: Dorothy Gay MRN: [id number] DOB: 08/12/1965 Patient declined to set up visit for follow up at this time.  Today's TOC FU Call Status: Today's TOC FU Call Status:: Successful TOC FU Call Competed Unsuccessful Call (1st Attempt) Date: 09/30/22 [facility] FU Call Complete Date: 09/30/22  Transition Care Management Follow-up Telephone Call Date of Discharge: 09/26/22 Discharge Facility: [facility] Type of Discharge: Emergency Department Reason for ED Visit: Respiratory (pneumonia) Respiratory Diagnosis: Pnuemonia How have you been since you were released from the hospital?: Better Any questions or concerns?: No  Items Reviewed: Did you receive and understand the discharge instructions provided?: Yes Medications obtained,verified, and reconciled?: Yes (Medications Reviewed) Any new allergies since your discharge?: No Dietary orders reviewed?: NA Do you have support at home?: Yes People in Home: child(ren), adult  Medications Reviewed Today: Medications Reviewed Today     Reviewed by Consuella Lose, CMA (Certified Medical Assistant) on 02/13/22 at 0818  Med List Status: <None>   Medication Order Taking? Sig Documenting Provider Last Dose Status Informant  albuterol (VENTOLIN HFA) 108 (90 Base) MCG/ACT inhaler [id number]  Inhale 2 puffs into the lungs every 6 (six) hours as needed for wheezing or shortness of breath. Eden Emms, NP  Active   Aspirin-Acetaminophen-Caffeine (GOODYS EXTRA STRENGTH) [phone] MG PACK [id number]  Take by mouth. [provider]  Active   EUTHYROX 75 MCG tablet [id number]  Take 1 tablet by mouth once daily Eden Emms, NP  Active   hydrochlorothiazide (HYDRODIURIL) 25 MG tablet [id number]  Take 1 tablet (25 mg total) by mouth daily. Eden Emms, NP  Active   ibuprofen (ADVIL) 200 MG tablet [id number]  Take 200 mg by mouth every 6 (six) hours as needed. [provider]  Active   losartan  (COZAAR) 100 MG tablet [id number]  Take 1 tablet (100 mg total) by mouth daily. Eden Emms, NP  Active             Home Care and Equipment/Supplies: Were Home Health Services Ordered?: NA Any new equipment or medical supplies ordered?: NA  Functional Questionnaire: Do you need assistance with bathing/showering or dressing?: No Do you need assistance with meal preparation?: No Do you need assistance with eating?: No Do you have difficulty maintaining continence: No Do you need assistance with getting out of bed/getting out of a chair/moving?: No Do you have difficulty managing or taking your medications?: No  Follow up appointments reviewed: PCP Follow-up appointment confirmed?: No (Pt declined appointment at this time.) MD Provider Line [phone] Given: Yes Specialist Hospital Follow-up appointment confirmed?: NA Do you need transportation to your follow-up appointment?: No Do you understand care options if your condition(s) worsen?: Yes-patient verbalized understanding    SIGNATURE Donnamarie Poag, CMA

## 2022-10-02 NOTE — Transitions of Care (Post Inpatient/ED Visit) (Signed)
   10/02/2022  Name: Dorothy Gay MRN: [id number] DOB: 1965/11/07  Today's TOC FU Call Status: Today's TOC FU Call Status:: Unsuccessful Call (3rd Attempt) Unsuccessful Call (1st Attempt) Date: 09/22/22 Unsuccessful Call (2nd Attempt) Date: 09/24/22 Unsuccessful Call (3rd Attempt) Date: 10/02/22 [facility] FU Call Complete Date: 10/02/22  Attempted to reach the patient regarding the most recent Inpatient/ED visit.  Follow Up Plan: No further outreach attempts will be made at this time. We have been unable to contact the patient.  Signature Agnes Lawrence, CMA (AAMA)  [facility] [phone]

## 2023-01-13 ENCOUNTER — Other Ambulatory Visit: Payer: Self-pay

## 2023-01-13 ENCOUNTER — Encounter: Payer: Self-pay | Admitting: Emergency Medicine

## 2023-01-13 DIAGNOSIS — H6691 Otitis media, unspecified, right ear: Secondary | ICD-10-CM | POA: Insufficient documentation

## 2023-01-13 DIAGNOSIS — Z79899 Other long term (current) drug therapy: Secondary | ICD-10-CM | POA: Insufficient documentation

## 2023-01-13 DIAGNOSIS — J449 Chronic obstructive pulmonary disease, unspecified: Secondary | ICD-10-CM | POA: Insufficient documentation

## 2023-01-13 DIAGNOSIS — I10 Essential (primary) hypertension: Secondary | ICD-10-CM | POA: Insufficient documentation

## 2023-01-13 DIAGNOSIS — E039 Hypothyroidism, unspecified: Secondary | ICD-10-CM | POA: Insufficient documentation

## 2023-01-13 NOTE — ED Triage Notes (Signed)
Pt presents ambulatory to triage via POV with complaints of R ear since Friday with some fullness. Pt notes placing some sweet oil in her ear without any improvement. A&Ox4 at this time. Denies dizziness, fevers,  CP or SOB.

## 2023-01-14 ENCOUNTER — Emergency Department
Admission: EM | Admit: 2023-01-14 | Discharge: 2023-01-14 | Disposition: A | Discharge status: Home / Self Care | Payer: Self-pay | Attending: Emergency Medicine | Admitting: Emergency Medicine

## 2023-01-14 DIAGNOSIS — H6991 Unspecified Eustachian tube disorder, right ear: Secondary | ICD-10-CM

## 2023-01-14 DIAGNOSIS — H9201 Otalgia, right ear: Secondary | ICD-10-CM

## 2023-01-14 MED ORDER — PREDNISONE 10 MG PO TABS
30.0000 mg | ORAL_TABLET | Freq: Once | ORAL | Status: AC
  Administered 2023-01-14: 30 mg via ORAL
  Filled 2023-01-14: qty 1

## 2023-01-14 MED ORDER — IBUPROFEN 800 MG PO TABS
800.0000 mg | ORAL_TABLET | Freq: Once | ORAL | Status: AC
  Administered 2023-01-14: 800 mg via ORAL
  Filled 2023-01-14: qty 1

## 2023-01-14 MED ORDER — OXYCODONE-ACETAMINOPHEN 5-325 MG PO TABS: 0.5000 | ORAL_TABLET | Freq: Four times a day (QID) | ORAL | 0 refills | Status: DC | PRN

## 2023-01-14 MED ORDER — METHYLPREDNISOLONE 4 MG PO TBPK: ORAL_TABLET | ORAL | 0 refills | Status: DC

## 2023-01-14 MED ORDER — LIDOCAINE HCL (PF) 1 % IJ SOLN
2.0000 mL | Freq: Once | INTRAMUSCULAR | Status: AC
  Administered 2023-01-14: 2 mL
  Filled 2023-01-14: qty 5

## 2023-01-14 NOTE — Discharge Instructions (Addendum)
Take steroid taper as directed. You may take Ibuprofen as needed for pain, 1/2 Percocet as needed for more severe pain.  Return to the ER for worsening symptoms, persistent vomiting, dizziness or other concerns.

## 2023-01-14 NOTE — ED Provider Notes (Signed)
[facility] Provider Note    Event Date/Time   First MD Initiated Contact with Patient 01/14/23 0041     (approximate)   History   Otalgia   HPI  Dorothy Gay is a 57 y.o. female who presents to the ED from home with a chief complaint of right ear pain x 5 days.  Endorses sinus pressure, ear fullness.  Denies fever/chills, nausea/vomiting, discharge from ear.     Past Medical History   Past Medical History:  Diagnosis Date   Anemia    Arthritis    Cancer (HCC)    Chronic bronchitis (HCC)    Headache    migraines   Heart murmur    Hypertension    Myocardial infarction (HCC) 09/2016   MILD PER PT-WHEN READING THRU HOSPITAL H&P IT LOOKS AS THOUGH PT DID NOT HAVE MI-TROPONINS WERE ELEVATED BUT STRESS WAS NEGATIVE     Active Problem List   Patient Active Problem List   Diagnosis Date Noted   Left foot pain 02/13/2022   Pain of left thumb 02/13/2022   Acute pain of right knee 12/11/2021   Upper respiratory tract infection 09/05/2021   Fever and chills 09/05/2021   Myalgia 09/05/2021   Rhinorrhea 09/05/2021   Sore throat 09/05/2021   Eustachian tube disorder, right 07/25/2021   Family history of diabetes mellitus 07/25/2021   History of anemia 07/25/2021   Osteoarthritis 04/05/2020   COPD (chronic obstructive pulmonary disease) (HCC) 04/05/2020   Migraines 04/05/2020   HTN (hypertension) 04/05/2020   HLD (hyperlipidemia) 04/05/2020   MI (myocardial infarction) (HCC) 04/05/2020   GERD (gastroesophageal reflux disease) 04/05/2020   Hypothyroidism 04/05/2020     Past Surgical History   Past Surgical History:  Procedure Laterality Date   CERVICAL CONIZATION W/BX N/A 10/30/2017   Procedure: CONIZATION CERVIX WITH BIOPSY;  Surgeon: Christeen Douglas, MD;  Location: [facility] ORS;  Service: Gynecology;  Laterality: N/A;   CESAREAN SECTION  1988    HYSTEROSCOPY WITH D & C N/A 10/30/2017   Procedure: DILATATION AND CURETTAGE /HYSTEROSCOPY;   Surgeon: Christeen Douglas, MD;  Location: [facility] ORS;  Service: Gynecology;  Laterality: N/A;   LEEP  1997     Home Medications   Prior to Admission medications   Medication Sig Start Date End Date Taking? Authorizing Provider  methylPREDNISolone (MEDROL DOSEPAK) 4 MG TBPK tablet Take as directed 01/14/23  Yes Irean Hong, MD  oxyCODONE-acetaminophen (PERCOCET/ROXICET) 5-325 MG tablet Take 0.5 tablets by mouth every 6 (six) hours as needed for severe pain. 01/14/23  Yes Irean Hong, MD  albuterol (PROVENTIL) (2.5 MG/3ML) 0.083% nebulizer solution Take 3 mLs (2.5 mg total) by nebulization every 4 (four) hours as needed for wheezing or shortness of breath. 09/21/22   Irean Hong, MD  albuterol (VENTOLIN HFA) 108 (90 Base) MCG/ACT inhaler Inhale 2 puffs into the lungs every 6 (six) hours as needed for wheezing or shortness of breath. 07/25/21   Eden Emms, NP  Aspirin-Acetaminophen-Caffeine (GOODYS EXTRA STRENGTH) [phone] MG PACK Take by mouth.    [provider]  azithromycin (ZITHROMAX Z-PAK) 250 MG tablet Take 2 tablets (500 mg) on  Day 1,  followed by 1 tablet (250 mg) once daily on Days 2 through 5. 09/26/22   Cuthriell, Delorise Royals, PA-C  benzonatate (TESSALON PERLES) 100 MG capsule Take 1 capsule (100 mg total) by mouth 3 (three) times daily as needed for cough. 09/26/22 09/26/23  Cuthriell, Delorise Royals, PA-C  brompheniramine-pseudoephedrine-DM 30-2-10  MG/5ML syrup Take 10 mLs by mouth 4 (four) times daily as needed. 09/26/22   Cuthriell, Delorise Royals, PA-C  chlorpheniramine-HYDROcodone (TUSSIONEX) 10-8 MG/5ML Take 5 mLs by mouth every 12 (twelve) hours as needed for cough. 09/21/22   Irean Hong, MD  EUTHYROX 75 MCG tablet Take 1 tablet (75 mcg total) by mouth daily. NEED OFFICE VISIT FOR FURTHER REFILLS 08/08/22   Eden Emms, NP  hydrochlorothiazide (HYDRODIURIL) 25 MG tablet Take 1 tablet (25 mg total) by mouth daily. 07/25/21   Eden Emms, NP  ibuprofen (ADVIL) 200 MG tablet  Take 200 mg by mouth every 6 (six) hours as needed.    [provider]  losartan (COZAAR) 100 MG tablet Take 1 tablet (100 mg total) by mouth daily. 12/11/21   Eden Emms, NP  Nebulizers (COMPRESSOR/NEBULIZER) MISC 1 Units by Does not apply route every 4 (four) hours as needed. 09/21/22   Irean Hong, MD  predniSONE (DELTASONE) 20 MG tablet 3 tablets daily x 4 days 09/21/22   Irean Hong, MD     Allergies  Shrimp extract, Amlodipine, Ferrous sulfate, Fish allergy, Haemophilus influenzae vaccines, Lisinopril, Shellfish allergy, Sumatriptan, and Mango flavor   Family History   Family History  Problem Relation Age of Onset   Hypertension Mother    Atrial fibrillation Mother    Stroke Mother    Thyroid disease Mother    Gout Mother    Heart attack Mother    Drug abuse Brother    Diabetes Daughter 26       type 2   Heart disease Maternal Grandmother    Stroke Maternal Grandmother    Diabetes Maternal Grandfather      Physical Exam  Triage Vital Signs: ED Triage Vitals  Encounter Vitals Group     BP 01/13/23 2221 (!) 193/90     Systolic BP Percentile --      Diastolic BP Percentile --      Pulse Rate 01/13/23 2221 63     Resp 01/13/23 2221 18     Temp 01/13/23 2221 97.9 F (36.6 C)     Temp src --      SpO2 01/13/23 2221 97 %     Weight 01/13/23 2217 170 lb (77.1 kg)     Height 01/13/23 2217 5\' 7"  (1.702 m)     Head Circumference --      Peak Flow --      Pain Score 01/13/23 2217 8     Pain Loc --      Pain Education --      Exclude from Growth Chart --     Updated Vital Signs: BP (!) 193/90 (BP Location: Left Arm)   Pulse 63   Temp 97.9 F (36.6 C)   Resp 18   Ht 5\' 7"  (1.702 m)   Wt 77.1 kg   SpO2 97%   BMI 26.63 kg/m    General: Awake, no distress.  CV:  Good peripheral perfusion.  Resp:  Normal effort.  Abd:  No distention.  Other:  Right TM with moderate fluid, no perforation.  No erythema.   ED Results / Procedures / Treatments   Labs (all labs ordered are listed, but only abnormal results are displayed) Labs Reviewed - No data to display   EKG  None   RADIOLOGY None   Official radiology report(s): No results found.   PROCEDURES:  Critical Care performed: No  Procedures   MEDICATIONS ORDERED IN ED:  Medications  lidocaine (PF) (XYLOCAINE) 1 % injection 2 mL (has no administration in time range)  predniSONE (DELTASONE) tablet 30 mg (has no administration in time range)  ibuprofen (ADVIL) tablet 800 mg (has no administration in time range)     IMPRESSION / MDM / ASSESSMENT AND PLAN / ED COURSE  I reviewed the triage vital signs and the nursing notes.                             57 year old female presenting with right otalgia.  Patient is not a diabetic; will start Medrol Dosepak for eustachian tube dysfunction, applied lidocaine drops and NSAIDs now.  Limited prescription for Percocet provided.  Patient will follow-up with ENT as needed.  Strict return precautions given.  Patient verbalizes understanding and agrees with plan of care.  Patient's presentation is most consistent with acute, uncomplicated illness.   FINAL CLINICAL IMPRESSION(S) / ED DIAGNOSES   Final diagnoses:  Otalgia of right ear  Eustachian tube dysfunction, right     Rx / DC Orders   ED Discharge Orders          Ordered    methylPREDNISolone (MEDROL DOSEPAK) 4 MG TBPK tablet        01/14/23 0059    oxyCODONE-acetaminophen (PERCOCET/ROXICET) 5-325 MG tablet  Every 6 hours PRN        01/14/23 0059             Note:  This document was prepared using Dragon voice recognition software and may include unintentional dictation errors.   Irean Hong, MD 01/14/23 [phone]

## 2023-06-09 ENCOUNTER — Emergency Department: Payer: Self-pay

## 2023-06-09 ENCOUNTER — Other Ambulatory Visit: Payer: Self-pay

## 2023-06-09 DIAGNOSIS — Z20822 Contact with and (suspected) exposure to covid-19: Secondary | ICD-10-CM | POA: Insufficient documentation

## 2023-06-09 DIAGNOSIS — J069 Acute upper respiratory infection, unspecified: Secondary | ICD-10-CM | POA: Insufficient documentation

## 2023-06-09 DIAGNOSIS — I10 Essential (primary) hypertension: Secondary | ICD-10-CM | POA: Insufficient documentation

## 2023-06-09 DIAGNOSIS — Z79899 Other long term (current) drug therapy: Secondary | ICD-10-CM | POA: Insufficient documentation

## 2023-06-09 LAB — RESP PANEL BY RT-PCR (RSV, FLU A&B, COVID)  RVPGX2
Influenza A by PCR: NEGATIVE
Influenza B by PCR: NEGATIVE
Resp Syncytial Virus by PCR: NEGATIVE
SARS Coronavirus 2 by RT PCR: NEGATIVE

## 2023-06-09 NOTE — ED Triage Notes (Signed)
Pt to ED via POV c/o cough x1 week and runny nose that started today.  Has been taking OTC meds with no relief. Denies any fevers, cp, shob, dizziness

## 2023-06-10 ENCOUNTER — Emergency Department
Admission: EM | Admit: 2023-06-10 | Discharge: 2023-06-10 | Disposition: A | Discharge status: Home / Self Care | Payer: Self-pay | Attending: Emergency Medicine | Admitting: Emergency Medicine

## 2023-06-10 DIAGNOSIS — J069 Acute upper respiratory infection, unspecified: Secondary | ICD-10-CM

## 2023-06-10 MED ORDER — ALBUTEROL SULFATE HFA 108 (90 BASE) MCG/ACT IN AERS: 2.0000 | INHALATION_SPRAY | RESPIRATORY_TRACT | 1 refills | Status: AC | PRN

## 2023-06-10 MED ORDER — PREDNISONE 20 MG PO TABS: 60.0000 mg | ORAL_TABLET | Freq: Every day | ORAL | 0 refills | Status: AC

## 2023-06-10 MED ORDER — PREDNISONE 20 MG PO TABS
60.0000 mg | ORAL_TABLET | Freq: Once | ORAL | Status: AC
  Administered 2023-06-10: 60 mg via ORAL
  Filled 2023-06-10: qty 3

## 2023-06-10 MED ORDER — HYDROCOD POLI-CHLORPHE POLI ER 10-8 MG/5ML PO SUER
5.0000 mL | Freq: Two times a day (BID) | ORAL | 0 refills | Status: AC | PRN
Start: 2023-06-10 — End: ?

## 2023-06-10 NOTE — Discharge Instructions (Addendum)

## 2023-06-10 NOTE — ED Provider Notes (Signed)
 [facility] Provider Note    Event Date/Time   First MD Initiated Contact with Patient 06/10/23 0040     (approximate)   History   Cough   HPI  Dorothy Gay is a 58 y.o. female with history of chronic bronchitis, migraines, hypertension who presents to the emergency department with a week of cough and congestion.  No fevers.  States she has had intermittent wheezing.  States initially the cough was productive but is now dry in nature.  She states she is having hard time sleeping because of the cough.   History provided by patient.    Past Medical History:  Diagnosis Date   Anemia    Arthritis    Cancer (HCC)    Chronic bronchitis (HCC)    Headache    migraines   Heart murmur    Hypertension    Myocardial infarction (HCC) 09/2016   MILD PER PT-WHEN READING THRU HOSPITAL H&P IT LOOKS AS THOUGH PT DID NOT HAVE MI-TROPONINS WERE ELEVATED BUT STRESS WAS NEGATIVE    Past Surgical History:  Procedure Laterality Date   CERVICAL CONIZATION W/BX N/A 10/30/2017   Procedure: CONIZATION CERVIX WITH BIOPSY;  Surgeon: Verdon Keen, MD;  Location: [facility] ORS;  Service: Gynecology;  Laterality: N/A;   CESAREAN SECTION  1988    HYSTEROSCOPY WITH D & C N/A 10/30/2017   Procedure: DILATATION AND CURETTAGE /HYSTEROSCOPY;  Surgeon: Verdon Keen, MD;  Location: [facility] ORS;  Service: Gynecology;  Laterality: N/A;   LEEP  1997    MEDICATIONS:  Prior to Admission medications   Medication Sig Start Date End Date Taking? Authorizing Provider  albuterol  (VENTOLIN  HFA) 108 (90 Base) MCG/ACT inhaler Inhale 2 puffs into the lungs every 4 (four) hours as needed for wheezing or shortness of breath. 06/10/23  Yes Anaijah Augsburger, Josette SAILOR, DO  chlorpheniramine-HYDROcodone (TUSSIONEX) 10-8 MG/5ML Take 5 mLs by mouth every 12 (twelve) hours as needed. 06/10/23  Yes Shireen Rayburn, Josette SAILOR, DO  predniSONE  (DELTASONE ) 20 MG tablet Take 3 tablets (60 mg total) by mouth daily. 06/10/23  Yes Aravind Chrismer,  Josette SAILOR, DO  Aspirin -Acetaminophen -Caffeine (GOODYS EXTRA STRENGTH) 500-325-65 MG PACK Take by mouth.    [provider]  benzonatate  (TESSALON  PERLES) 100 MG capsule Take 1 capsule (100 mg total) by mouth 3 (three) times daily as needed for cough. 09/26/22 09/26/23  Cuthriell, Dorn BIRCH, PA-C  EUTHYROX  75 MCG tablet Take 1 tablet (75 mcg total) by mouth daily. NEED OFFICE VISIT FOR FURTHER REFILLS 08/08/22   Wendee Lynwood HERO, NP  hydrochlorothiazide  (HYDRODIURIL ) 25 MG tablet Take 1 tablet (25 mg total) by mouth daily. 07/25/21   Wendee Lynwood HERO, NP  ibuprofen  (ADVIL ) 200 MG tablet Take 200 mg by mouth every 6 (six) hours as needed.    [provider]  losartan  (COZAAR ) 100 MG tablet Take 1 tablet (100 mg total) by mouth daily. 12/11/21   Wendee Lynwood HERO, NP  Nebulizers (COMPRESSOR/NEBULIZER) MISC 1 Units by Does not apply route every 4 (four) hours as needed. 09/21/22   Robinette Vermell PARAS, MD    Physical Exam   Triage Vital Signs: ED Triage Vitals  Encounter Vitals Group     BP 06/09/23 2256 (!) 185/94     Systolic BP Percentile --      Diastolic BP Percentile --      Pulse Rate 06/09/23 2256 67     Resp 06/09/23 2256 18     Temp 06/09/23 2256 98.2 F (36.8 C)  Temp Source 06/09/23 2256 Oral     SpO2 06/09/23 2256 99 %     Weight 06/09/23 2253 170 lb (77.1 kg)     Height 06/09/23 2253 5' 7 (1.702 m)     Head Circumference --      Peak Flow --      Pain Score 06/09/23 2253 0     Pain Loc --      Pain Education --      Exclude from Growth Chart --     Most recent vital signs: Vitals:   06/09/23 2256 06/10/23 0058  BP: (!) 185/94 137/82  Pulse: 67 72  Resp: 18 17  Temp: 98.2 F (36.8 C) 98.1 F (36.7 C)  SpO2: 99% 99%    CONSTITUTIONAL: Alert, responds appropriately to questions. Well-appearing; well-nourished HEAD: Normocephalic, atraumatic EYES: Conjunctivae clear, pupils appear equal, sclera nonicteric ENT: normal nose; moist mucous membranes NECK: Supple,  normal ROM CARD: RRR; S1 and S2 appreciated RESP: Normal chest excursion without splinting or tachypnea; breath sounds clear and equal bilaterally; no wheezes, no rhonchi, no rales, no hypoxia or respiratory distress, speaking full sentences ABD/GI: Non-distended; soft, non-tender, no rebound, no guarding, no peritoneal signs BACK: The back appears normal EXT: Normal ROM in all joints; no deformity noted, no edema SKIN: Normal color for age and race; warm; no rash on exposed skin NEURO: Moves all extremities equally, normal speech PSYCH: The patient's mood and manner are appropriate.   ED Results / Procedures / Treatments   LABS: (all labs ordered are listed, but only abnormal results are displayed) Labs Reviewed  RESP PANEL BY RT-PCR (RSV, FLU A&B, COVID)  [id number]     EKG:  EKG Interpretation Date/Time:    Ventricular Rate:    PR Interval:    QRS Duration:    QT Interval:    QTC Calculation:   R Axis:      Text Interpretation:           RADIOLOGY: My personal review and interpretation of imaging: Chest x-ray clear.  I have personally reviewed all radiology reports.   DG Chest 2 View Result Date: 06/09/2023 CLINICAL DATA:  Cough EXAM: CHEST - 2 VIEW COMPARISON:  09/26/2022, CT lung bases 08/10/2021, chest x-ray 05/20/2018 FINDINGS: Streaky atelectasis or scarring in the right mid and lower lung with mild reticular opacity. Probable chronic pleural blunting at the right CP angle. Left lung clear. Borderline cardiomegaly. No pneumothorax IMPRESSION: Scarring in the right mid to lower lung. No radiographic evidence for acute cardiopulmonary abnormality. Electronically Signed   By: Luke Bun M.D.   On: 06/09/2023 23:16     PROCEDURES:  Critical Care performed: No     Procedures    IMPRESSION / MDM / ASSESSMENT AND PLAN / ED COURSE  I reviewed the triage vital signs and the nursing notes.    Patient here with cough, congestion.  History of chronic  bronchitis.     DIFFERENTIAL DIAGNOSIS (includes but not limited to):   Viral URI, pneumonia, bronchospasm, doubt ACS, CHF, PE   Patient's presentation is most consistent with acute complicated illness / injury requiring diagnostic workup.   PLAN: COVID, flu and RSV negative.  Chest x-ray reviewed and interpreted by the radiologist and is clear.  Lungs clear to auscultation without hypoxia, respiratory distress or increased work of breathing.  Will discharge with albuterol  inhaler, prednisone  burst and Tussionex for symptomatic relief.  Patient drove herself to the emergency department and is comfortable with just picking  the prescriptions up in the morning.  Recommended follow-up with PCP if symptoms not improving.  No indication that patient needs antibiotics today.   MEDICATIONS GIVEN IN ED: Medications  predniSONE  (DELTASONE ) tablet 60 mg (60 mg Oral Given 06/10/23 0055)     ED COURSE:  At this time, I do not feel there is any life-threatening condition present. I reviewed all nursing notes, vitals, pertinent previous records.  All lab and urine results, EKGs, imaging ordered have been independently reviewed and interpreted by myself.  I reviewed all available radiology reports from any imaging ordered this visit.  Based on my assessment, I feel the patient is safe to be discharged home without further emergent workup and can continue workup as an outpatient as needed. Discussed all findings, treatment plan as well as usual and customary return precautions.  They verbalize understanding and are comfortable with this plan.  Outpatient follow-up has been provided as needed.  All questions have been answered.    CONSULTS:  none   OUTSIDE RECORDS REVIEWED: Reviewed last family medicine note in October 2023.       FINAL CLINICAL IMPRESSION(S) / ED DIAGNOSES   Final diagnoses:  Viral URI with cough     Rx / DC Orders   ED Discharge Orders          Ordered    predniSONE   (DELTASONE ) 20 MG tablet  Daily        06/10/23 0048    chlorpheniramine-HYDROcodone (TUSSIONEX) 10-8 MG/5ML  Every 12 hours PRN        06/10/23 0048    albuterol  (VENTOLIN  HFA) 108 (90 Base) MCG/ACT inhaler  Every 4 hours PRN        06/10/23 0048             Note:  This document was prepared using Dragon voice recognition software and may include unintentional dictation errors.   Trampus Mcquerry, Josette SAILOR, DO 06/10/23 [phone]

## 2023-06-15 ENCOUNTER — Telehealth: Payer: Self-pay

## 2023-06-15 NOTE — Transitions of Care (Post Inpatient/ED Visit) (Signed)
 Unable to reach patient by phone and left v/m requesting call back at [phone].        06/15/2023  Name: Dorothy Gay MRN: [id number] DOB: 1965-05-18  Today's TOC FU Call Status: Today's TOC FU Call Status:: Unsuccessful Call (1st Attempt) Unsuccessful Call (1st Attempt) Date: 06/15/23  Attempted to reach the patient regarding the most recent Inpatient/ED visit.  Follow Up Plan: Additional outreach attempts will be made to reach the patient to complete the Transitions of Care (Post Inpatient/ED visit) call.   Signature  .mec
